# Patient Record
Sex: Male | Born: 1995 | Race: Black or African American | Hispanic: No | Marital: Single | Smoking: Never smoker
Health system: Southern US, Community
[De-identification: ages and names within clinical notes are randomized; demographics above are authoritative.]

## PROBLEM LIST (undated history)

## (undated) DIAGNOSIS — F32A Depression, unspecified: Secondary | ICD-10-CM

## (undated) DIAGNOSIS — F319 Bipolar disorder, unspecified: Secondary | ICD-10-CM

## (undated) DIAGNOSIS — F329 Major depressive disorder, single episode, unspecified: Secondary | ICD-10-CM

## (undated) HISTORY — PX: TESTICLE SURGERY: SHX794

## (undated) HISTORY — PX: HERNIA REPAIR: SHX51

---
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
## 2017-02-15 ENCOUNTER — Ambulatory Visit (INDEPENDENT_AMBULATORY_CARE_PROVIDER_SITE_OTHER): Payer: BC Managed Care – PPO | Admitting: Family Medicine

## 2017-02-15 VITALS — BP 118/60 | HR 80 | Temp 98.3°F | Ht 72.0 in | Wt 221.0 lb

## 2017-02-15 DIAGNOSIS — Z0000 Encounter for general adult medical examination without abnormal findings: Secondary | ICD-10-CM

## 2017-02-15 MED ORDER — LAMOTRIGINE 100 MG PO TABS: 100.0000 mg | ORAL_TABLET | Freq: Every day | ORAL | 0 refills | Status: DC

## 2017-02-15 NOTE — Progress Notes (Signed)
Subjective:     Kyle Mcmahon is a 21 y.o. male who presents to [facility] today for comprehensive physical examination:  CPE  Concerns:  None today.   - He does alert me that he has been Started on Lamictal while at college for anxiety and depression issues. He is on 100 mg daily. His has experienced a notable improvement in his symptoms and decrease in his depression since being started this medicine. [facility]  - living at home with mom this summer, trying to find a job.  He has noted some weight gain since being college. He is increasing his activity to try to combat this.  Last physical 2 years ago Eye exam:  Declined -- no changes or worsening of his vision Dental exam every six months.   PMH reviewed. Patient is a nonsmoker.   No past medical history on file. Past Surgical History:  Procedure Laterality Date  . HERNIA REPAIR     baby  . 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 No current outpatient prescriptions on file.   No current facility-administered medications for this visit.     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  Wants to major in media/journalism  Family History:  Mother also takes lamictal.  She is on the for bipolar disorder.   Review of Systems  Constitutional: Negative for fever.  HENT: Negative for congestion.  Occasional cerumen impactions, but doing well recently.  Eyes: Negative for blurred vision.  Respiratory: Negative for cough and wheezing.   Cardiovascular: Negative for chest pain, palpitations and leg swelling.  Gastrointestinal: Negative for nausea, vomiting and abdominal pain.  Genitourinary: Negative for dysuria, hematuria and flank pain.  Skin: Negative for rash.  Psychiatric/Behavioral: Negative  for  suicidal ideas.  Does endorse some depressive symptoms as above.     Impression/Plan: 1. Complete Physical Examination: anticipatory guidance provided.  - Otherwise doing very well.  No concerns today.   - Discussed following up If he needs anything from us this summer. Can also use a follow-up with us will his school if you would like. -He is continuing to follow with his psychiatrist. He can still see us if he has any issues. Follow-up in one year

## 2017-02-15 NOTE — Patient Instructions (Signed)
It was very good to see you again today.  Everything looks good today from my standpoint.    Best of luck next year at school.  If you need anything over the summer or next year, let us know.    Cheer hard during basketball season!

## 2017-02-15 NOTE — Assessment & Plan Note (Signed)
Doing well today.   States he has received his tetanus -- he is at college so I'm assuming he would have had this done before enrolling.   Will check [facility] to make sure. Otherwise discussed safe sexual practices while at school. FU in 1 year.

## 2017-02-17 ENCOUNTER — Encounter: Payer: Self-pay | Admitting: Student

## 2017-02-17 ENCOUNTER — Ambulatory Visit (INDEPENDENT_AMBULATORY_CARE_PROVIDER_SITE_OTHER): Payer: BC Managed Care – PPO | Admitting: Student

## 2017-02-17 DIAGNOSIS — L0291 Cutaneous abscess, unspecified: Secondary | ICD-10-CM

## 2017-02-17 MED ORDER — CEPHALEXIN 500 MG PO CAPS: 500.0000 mg | ORAL_CAPSULE | Freq: Four times a day (QID) | ORAL | 0 refills | Status: AC

## 2017-02-17 NOTE — Patient Instructions (Addendum)
Follow up as needed Take antibiotics as prescribed If you have fevers or worsening pain, call the office Call the office with questions or concerns

## 2017-02-17 NOTE — Progress Notes (Signed)
   Subjective:    Patient ID: Kyle MinorJohn C Mcmahon, male    DOB: [birth year], 21 y.o.   MRN: [id number]   CC: bump on left nipple  HPI: 21 y/o male presents for lump on left breast  Lump on left breast - note dit yesterday AM - looks like a large pimple - painful, no fevers - no surrounding rash - he feels a hair follicle got infected  Smoking status reviewed  Review of Systems  Per HPI, else denies chest pain, shortness of breath,   Objective:  BP 110/80   Temp 97.5 F (36.4 C) (Oral)   Wt 222 lb (100.7 kg)   SpO2 98%   BMI 30.11 kg/m  Vitals and nursing note reviewed  General: NAD Cardiac: RRR, Respiratory: CTAB, normal effort Skin: approx 3-4 mm in diameter abscess at the 11 oclock position of the left nipple, minimal surrounding erythema, else warm and dry, no rashes noted Neuro: alert and oriented, no focal deficits   Assessment & Plan:    Abscess Small abscess drained in the clinic without issues ( See procedure note).  -Keflex for possible surrounding infection - call as needed    Koren Sermersheim A. Kennon RoundsHaney MD, MS Family Medicine Resident PGY-3 Pager [phone]

## 2017-02-17 NOTE — Assessment & Plan Note (Signed)
Small abscess drained in the clinic without issues ( See procedure note).  -Keflex for possible surrounding infection - call as needed

## 2017-02-17 NOTE — Progress Notes (Signed)
PROCEDURE NOTE: Verbal consent obtained. Risks and benefits of the procedure were explained to the patient. Patient made an informed decision to proceed with the procedure. Betadine prep per usual protocol. Local anesthesia obtained with Pain Ease spray  2-3 mm cm incision made with 11 blade along lesion.  Copious purulence expressed. Lesion explored revealing no loculations. No packing usued  Dressed. Wound care instructions including precautions with patient. Patient tolerated the procedure well.   Daquarius Dubeau A. Kennon RoundsHaney MD, MS Family Medicine Resident PGY-3 Pager [phone]

## 2017-05-07 ENCOUNTER — Emergency Department (HOSPITAL_COMMUNITY)
Admission: EM | Admit: 2017-05-07 | Discharge: 2017-05-08 | Disposition: A | Discharge status: Home / Self Care | Payer: BC Managed Care – PPO | Attending: Emergency Medicine | Admitting: Emergency Medicine

## 2017-05-07 ENCOUNTER — Encounter (HOSPITAL_COMMUNITY): Payer: Self-pay

## 2017-05-07 DIAGNOSIS — K5900 Constipation, unspecified: Secondary | ICD-10-CM | POA: Diagnosis not present

## 2017-05-07 DIAGNOSIS — Z79899 Other long term (current) drug therapy: Secondary | ICD-10-CM | POA: Diagnosis not present

## 2017-05-07 LAB — COMPREHENSIVE METABOLIC PANEL
ALBUMIN: 4.4 g/dL (ref 3.5–5.0)
ALT: 20 U/L (ref 17–63)
ANION GAP: 8 (ref 5–15)
AST: 23 U/L (ref 15–41)
Alkaline Phosphatase: 61 U/L (ref 38–126)
BUN: 15 mg/dL (ref 6–20)
CHLORIDE: 102 mmol/L (ref 101–111)
CO2: 29 mmol/L (ref 22–32)
Calcium: 9.5 mg/dL (ref 8.9–10.3)
Creatinine, Ser: 1.1 mg/dL (ref 0.61–1.24)
GFR calc non Af Amer: >60 mL/min (ref >60)
GLUCOSE: 75 mg/dL (ref 65–99)
POTASSIUM: 3.8 mmol/L (ref 3.5–5.1)
SODIUM: 139 mmol/L (ref 135–145)
Total Bilirubin: 0.9 mg/dL (ref 0.3–1.2)
Total Protein: 7.8 g/dL (ref 6.5–8.1)

## 2017-05-07 LAB — URINALYSIS, ROUTINE W REFLEX MICROSCOPIC
BILIRUBIN URINE: NEGATIVE
Glucose, UA: NEGATIVE mg/dL
HGB URINE DIPSTICK: NEGATIVE
Ketones, ur: NEGATIVE mg/dL
Leukocytes, UA: NEGATIVE
Nitrite: NEGATIVE
PH: 6 (ref 5.0–8.0)
Protein, ur: NEGATIVE mg/dL
SPECIFIC GRAVITY, URINE: 1.023 (ref 1.005–1.030)

## 2017-05-07 LAB — CBC
HEMATOCRIT: 41.7 % (ref 39.0–52.0)
HEMOGLOBIN: 14.1 g/dL (ref 13.0–17.0)
MCH: 27.6 pg (ref 26.0–34.0)
MCHC: 33.8 g/dL (ref 30.0–36.0)
MCV: 81.8 fL (ref 78.0–100.0)
Platelets: 305 10*3/uL (ref 150–400)
RBC: 5.1 MIL/uL (ref 4.22–5.81)
RDW: 13.2 % (ref 11.5–15.5)
WBC: 9.9 10*3/uL (ref 4.0–10.5)

## 2017-05-07 LAB — LIPASE, BLOOD: LIPASE: 38 U/L (ref 11–51)

## 2017-05-07 MED ORDER — ONDANSETRON 4 MG PO TBDP
4.0000 mg | ORAL_TABLET | Freq: Once | ORAL | Status: AC | PRN
  Administered 2017-05-07: 4 mg via ORAL

## 2017-05-07 MED ORDER — ONDANSETRON 4 MG PO TBDP
ORAL_TABLET | ORAL | Status: AC
  Filled 2017-05-07: qty 1

## 2017-05-07 NOTE — ED Triage Notes (Signed)
Onset 2 nights ago abd pain, constipation, and nausea.  Seen at [facility], was given Maalox, Zofran, Carafate, lidocaine viscous.  Yesterday pt did two enemas and two laxatives, good results.  Pt still nauseated and has abd pain.

## 2017-05-07 NOTE — ED Notes (Signed)
Pt would like to wait for provider to see him before IV is placed

## 2017-05-08 ENCOUNTER — Emergency Department (HOSPITAL_COMMUNITY): Payer: BC Managed Care – PPO

## 2017-05-08 MED ORDER — DOCUSATE SODIUM 100 MG PO CAPS: 100.0000 mg | ORAL_CAPSULE | Freq: Two times a day (BID) | ORAL | 0 refills | Status: DC

## 2017-05-08 MED ORDER — POLYETHYLENE GLYCOL 3350 17 GM/SCOOP PO POWD: 17.0000 g | Freq: Two times a day (BID) | ORAL | 0 refills | Status: DC

## 2017-05-08 NOTE — Discharge Instructions (Signed)
Take 2 tablets per day of the Colace until your stools soften, and then take 1 tablet per day until regular.  Take Miralax daily for 2 weeks.

## 2017-05-08 NOTE — ED Provider Notes (Signed)
[facility]-EMERGENCY DEPT Provider Note   CSN: [id number] Arrival date & time: 05/07/17  2023     History   Chief Complaint Chief Complaint  Patient presents with  . Abdominal Pain    HPI Kyle Mcmahon is a 21 y.o. male.  Patient presents to the emergency department with chief complaint of constipation. He states that he has been constipated for the past week. He reports some associated abdominal discomfort. He reports associated nausea, but denies any vomiting. He denies any fevers or chills. He has tried taking a laxative and enema with some relief. There are no other associated symptoms. There are no modifying factors.   The history is provided by the patient. No language interpreter was used.    History reviewed. No pertinent past medical history.  Patient Active Problem List   Diagnosis Date Noted  . Abscess 02/17/2017  . Preventative health care 08/05/2014    Past Surgical History:  Procedure Laterality Date  . HERNIA REPAIR     baby  . 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 Authorizing Provider  lamoTRIgine (LAMICTAL) 100 MG tablet Take 1 tablet (100 mg total) by mouth daily. Prescribed by psychiatrist at [facility] Patient taking differently: Take 100 mg by mouth at bedtime. Prescribed by psychiatrist at [facility] 02/15/17  Yes Tobey Grim, MD    Family History History reviewed. No pertinent family history.  Social History Social History  Substance Use Topics  . Smoking status: Never Smoker  . Smokeless tobacco: Never Used  . Alcohol use No     Allergies   Patient has no known allergies.   Review of Systems Review of Systems  All other systems reviewed and are negative.    Physical Exam Updated Vital Signs BP (!) 153/77 (BP Location: Right Arm)   Pulse 63   Temp 98.5 F (36.9 C) (Oral)   Resp 18   SpO2 100%   Physical Exam    Constitutional: He is oriented to person, place, and time. He appears well-developed and well-nourished.  HENT:  Head: Normocephalic and atraumatic.  Eyes: Pupils are equal, round, and reactive to light. Conjunctivae and EOM are normal. Right eye exhibits no discharge. Left eye exhibits no discharge. No scleral icterus.  Neck: Normal range of motion. Neck supple. No JVD present.  Cardiovascular: Normal rate, regular rhythm and normal heart sounds.  Exam reveals no gallop and no friction rub.   No murmur heard. Pulmonary/Chest: Effort normal and breath sounds normal. No respiratory distress. He has no wheezes. He has no rales. He exhibits no tenderness.  Abdominal: Soft. He exhibits no distension and no mass. There is no tenderness. There is no rebound and no guarding.  No focal abdominal tenderness, no RLQ tenderness or pain at McBurney's point, no RUQ tenderness or Murphy's sign, no left-sided abdominal tenderness, no fluid wave, or signs of peritonitis   Musculoskeletal: Normal range of motion. He exhibits no edema or tenderness.  Neurological: He is alert and oriented to person, place, and time.  Skin: Skin is warm and dry.  Psychiatric: He has a normal mood and affect. His behavior is normal. Judgment and thought content normal.  Nursing note and vitals reviewed.    ED Treatments / Results  Labs (all labs ordered are listed, but only abnormal results are displayed) Labs Reviewed  LIPASE, BLOOD  COMPREHENSIVE METABOLIC PANEL  CBC  URINALYSIS, ROUTINE W REFLEX MICROSCOPIC    EKG  EKG Interpretation None       Radiology No results found.  Procedures Procedures (including critical care time)  Medications Ordered in ED Medications  ondansetron (ZOFRAN-ODT) disintegrating tablet 4 mg (4 mg Oral Given 05/07/17 2042)     Initial Impression / Assessment and Plan / ED Course  I have reviewed the triage vital signs and the nursing notes.  Pertinent labs & imaging results  that were available during my care of the patient were reviewed by me and considered in my medical decision making (see chart for details).     Patient with generalized abdominal discomfort. He does not have any focal tenderness on exam. His vital signs and labs are unremarkable. He reports that he has been constipated, I believe that this likely the cause of his abdominal discomfort, but will check plain films.  Anticipate discharge to home with change in bowel regimen.  Imaging consistent with constipation.  Final Clinical Impressions(s) / ED Diagnoses   Final diagnoses:  Constipation, unspecified constipation type    New Prescriptions Discharge Medication List as of 05/08/2017  2:29 AM    START taking these medications   Details  docusate sodium (COLACE) 100 MG capsule Take 1 capsule (100 mg total) by mouth every 12 (twelve) hours., Starting Sun 05/08/2017, Print    polyethylene glycol powder (GLYCOLAX/MIRALAX) powder Take 17 g by mouth 2 (two) times daily. Until daily soft stools  OTC, Starting Sun 05/08/2017, Print         Roxy Horseman, PA-C 05/08/17 0606    Ward, Layla Maw, DO 05/08/17 [phone]

## 2017-07-10 ENCOUNTER — Encounter (HOSPITAL_COMMUNITY): Payer: Self-pay | Admitting: Behavioral Health

## 2017-07-10 ENCOUNTER — Emergency Department (HOSPITAL_COMMUNITY)
Admission: EM | Admit: 2017-07-10 | Discharge: 2017-07-12 | Disposition: A | Discharge status: Other Acute Inpatient Hospital | Payer: BC Managed Care – PPO | Attending: Emergency Medicine | Admitting: Emergency Medicine

## 2017-07-10 DIAGNOSIS — F339 Major depressive disorder, recurrent, unspecified: Secondary | ICD-10-CM | POA: Diagnosis not present

## 2017-07-10 DIAGNOSIS — F329 Major depressive disorder, single episode, unspecified: Secondary | ICD-10-CM | POA: Diagnosis present

## 2017-07-10 DIAGNOSIS — R450 Nervousness: Secondary | ICD-10-CM | POA: Diagnosis not present

## 2017-07-10 DIAGNOSIS — F1210 Cannabis abuse, uncomplicated: Secondary | ICD-10-CM | POA: Diagnosis not present

## 2017-07-10 DIAGNOSIS — F419 Anxiety disorder, unspecified: Secondary | ICD-10-CM | POA: Diagnosis not present

## 2017-07-10 DIAGNOSIS — Z79899 Other long term (current) drug therapy: Secondary | ICD-10-CM | POA: Insufficient documentation

## 2017-07-10 DIAGNOSIS — R4689 Other symptoms and signs involving appearance and behavior: Secondary | ICD-10-CM | POA: Diagnosis not present

## 2017-07-10 DIAGNOSIS — F19959 Other psychoactive substance use, unspecified with psychoactive substance-induced psychotic disorder, unspecified: Secondary | ICD-10-CM | POA: Diagnosis not present

## 2017-07-10 HISTORY — DX: Depression, unspecified: F32.A

## 2017-07-10 HISTORY — DX: Major depressive disorder, single episode, unspecified: F32.9

## 2017-07-10 LAB — COMPREHENSIVE METABOLIC PANEL
ALBUMIN: 4.3 g/dL (ref 3.5–5.0)
ALK PHOS: 59 U/L (ref 38–126)
ALT: 24 U/L (ref 17–63)
AST: 33 U/L (ref 15–41)
Anion gap: 14 (ref 5–15)
BUN: 9 mg/dL (ref 6–20)
CALCIUM: 9.3 mg/dL (ref 8.9–10.3)
CO2: 24 mmol/L (ref 22–32)
Chloride: 98 mmol/L — ABNORMAL LOW (ref 101–111)
Creatinine, Ser: 0.95 mg/dL (ref 0.61–1.24)
GFR calc Af Amer: >60 mL/min (ref >60)
GFR calc non Af Amer: >60 mL/min (ref >60)
Glucose, Bld: 160 mg/dL — ABNORMAL HIGH (ref 65–99)
Potassium: 3.3 mmol/L — ABNORMAL LOW (ref 3.5–5.1)
SODIUM: 136 mmol/L (ref 135–145)
TOTAL PROTEIN: 8.3 g/dL — AB (ref 6.5–8.1)
Total Bilirubin: 1.1 mg/dL (ref 0.3–1.2)

## 2017-07-10 LAB — SALICYLATE LEVEL: Salicylate Lvl: <7 mg/dL (ref 2.8–30.0)

## 2017-07-10 LAB — CBC WITH DIFFERENTIAL/PLATELET
BASOS ABS: 0 10*3/uL (ref 0.0–0.1)
Basophils Relative: 0 %
EOS ABS: 0 10*3/uL (ref 0.0–0.7)
EOS PCT: 0 %
HCT: 41.8 % (ref 39.0–52.0)
HEMOGLOBIN: 14.2 g/dL (ref 13.0–17.0)
Lymphocytes Relative: 17 %
Lymphs Abs: 1.6 10*3/uL (ref 0.7–4.0)
MCH: 28 pg (ref 26.0–34.0)
MCHC: 34 g/dL (ref 30.0–36.0)
MCV: 82.3 fL (ref 78.0–100.0)
Monocytes Absolute: 0.6 10*3/uL (ref 0.1–1.0)
Monocytes Relative: 7 %
NEUTROS PCT: 76 %
Neutro Abs: 7 10*3/uL (ref 1.7–7.7)
PLATELETS: 294 10*3/uL (ref 150–400)
RBC: 5.08 MIL/uL (ref 4.22–5.81)
RDW: 13.5 % (ref 11.5–15.5)
WBC: 9.3 10*3/uL (ref 4.0–10.5)

## 2017-07-10 LAB — RAPID URINE DRUG SCREEN, HOSP PERFORMED
Amphetamines: NOT DETECTED
Barbiturates: NOT DETECTED
Benzodiazepines: NOT DETECTED
COCAINE: NOT DETECTED
OPIATES: NOT DETECTED
Tetrahydrocannabinol: NOT DETECTED

## 2017-07-10 LAB — ACETAMINOPHEN LEVEL: Acetaminophen (Tylenol), Serum: <10 ug/mL — ABNORMAL LOW (ref 10–30)

## 2017-07-10 LAB — ETHANOL

## 2017-07-10 LAB — CK: CK TOTAL: 244 U/L (ref 49–397)

## 2017-07-10 MED ORDER — LORAZEPAM 1 MG PO TABS
2.0000 mg | ORAL_TABLET | Freq: Four times a day (QID) | ORAL | Status: DC | PRN
Start: 2017-07-10 — End: 2017-07-11

## 2017-07-10 MED ORDER — LORAZEPAM 1 MG PO TABS
2.0000 mg | ORAL_TABLET | ORAL | Status: AC
  Filled 2017-07-10: qty 2

## 2017-07-10 MED ORDER — LORAZEPAM 1 MG PO TABS: 2.0000 mg | ORAL_TABLET | Freq: Four times a day (QID) | ORAL | Status: DC | PRN

## 2017-07-10 MED ORDER — SODIUM CHLORIDE 0.9 % IV SOLN
INTRAVENOUS | Status: DC
  Administered 2017-07-10: 11:00:00 via INTRAVENOUS

## 2017-07-10 MED ORDER — LORAZEPAM 2 MG/ML IJ SOLN: 2.0000 mg | INTRAMUSCULAR | Status: AC

## 2017-07-10 MED ORDER — LORAZEPAM 2 MG/ML IJ SOLN
2.0000 mg | Freq: Four times a day (QID) | INTRAMUSCULAR | Status: DC | PRN
  Administered 2017-07-10: 2 mg via INTRAMUSCULAR
  Filled 2017-07-10: qty 1

## 2017-07-10 NOTE — Progress Notes (Signed)
Parents were concerned about his disposition but the patient refused to sign a release of information along with other forms of care.  Unfortunately, this information cannot be communicated until the patient consents.  Celso AmyJamie Lord, PMHNP

## 2017-07-10 NOTE — ED Triage Notes (Signed)
Pt in via EMS with [employer]- Per EMS and [employer] officer, pt called after pt was found catatonic but when EMS arrived, pt was psychotic and combative. Pt parents add that pt has hx of depression, paranoia and has just started Lamictal within the past week. Pt admits to smoking marijuana. Pt received 10 mg IM haldol en route for aggression. [facility] sleepy [facility]. Pt in NAD

## 2017-07-10 NOTE — ED Notes (Addendum)
PRN IM provided for pt posturing as though displaying EPS with head pull and facial grimace. EDP saw pt and medication provided pt now still lying in bed contorted but supple musculature. And is now chill to the touch. Will continue to assess.

## 2017-07-10 NOTE — BHH Counselor (Signed)
Attempted assessment.  Pt refused, acting as if catatonic, although per report, he had been upright and speaking moments before.  Will attempt assessment again soon.

## 2017-07-10 NOTE — ED Provider Notes (Signed)
Asked to evaluate patient by nursing in the behavioral health area.  Patient with some bizarre behavior.  But no cogwheeling no rigidity.  Will give a trial of some Ativan p.o. or IM orders placed for this.  Patient appears to be nontoxic no acute distress.  If this does not work patient may require some Geodon.  Patient received Haldol according to notes prior to arrival.  He arrived around 8 in the morning.   Vanetta MuldersZackowski, Rubel Heckard, MD 07/10/17 2221

## 2017-07-10 NOTE — ED Notes (Addendum)
Pt received, diaphoretic and slightly febrile temp 99.2 oral. Pt forehead covered in sweat and pt walking very slowly with short step gait to the door of room 39. Pt asked why he was being sent over here. And that was the only time the pt spoke.  Pt not answering writers questions but holding intense eye contact with lips tightly clinched and strained taught posture. Pt gave no verbal answers for assessment questions related to pain, but slightly shook head in 'no' gesture when asked about hearing or seeing things the writer could not at the time.

## 2017-07-10 NOTE — ED Notes (Signed)
Pt mother called at this time to inquire about pt signing consent. This RN told mother consent to release information has not been signed. Mother [phone]

## 2017-07-10 NOTE — ED Notes (Signed)
Bed: [id number] Expected date:  Expected time:  Means of arrival:  Comments: bigalow

## 2017-07-10 NOTE — ED Notes (Signed)
Dinner is placed at pt bedside. Pt mother and sister at bed side at this time

## 2017-07-10 NOTE — ED Notes (Signed)
Pt mother at bedside at this time. Pt refuses to communicate with staff and family at this time.

## 2017-07-10 NOTE — ED Notes (Signed)
Pt and family both informed in detail the authorization for use/disclosure of protected health information and pt is adamant about NOT allowing family to have information for the duration of his visit. Family advised by this RN to discontinue the request for patient to sign necessary documentation at this time.

## 2017-07-10 NOTE — ED Notes (Signed)
Pt father at bedside at this time. NAD noted.

## 2017-07-10 NOTE — ED Notes (Signed)
Pt father at bedside at this time. 

## 2017-07-10 NOTE — BH Assessment (Signed)
Assessment Note  Kyle Mcmahon is a 21 y.o. male who presented to [facility] ED on 07/10/17 with complaint of paranoia.  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  Per report, Pt is under psychiatric care there for long-standing depression, and his psychiatrist prescribes Lamictal and Prozac (Prozac added within the last 10 days).  Per report, Pt endorsed paranoia, agitation, and marijuana use.  Also per report, Pt denied SI and HI.  UDS was clear, and so it is recommended that Pt be screened for K2.  Pt was non-responsive to questions, presenting as asleep.  Nurse stated that Pt was alert and speaking shortly before author attempted to assess.  Pt could not or would not be roused for assessment.  Author attempted assessment twice.  Per mother, she went [place].  She observed that Pt did not seem like himself.  On Saturday, Pt called to say he was upset.  [place]  This morning, Pt was awake before 6 AM, shaking and yelling that he was scared.  Per mother, Pt began gesticulating in unusual manner.  Pt's mother called EMS.  EMS administered a Haldol to Pt because of his aggressive behavior en route to the hospital.  Also per mother, Pt has been treated for depression for approximately eight months.  He receives outpatient psychiatric services.  He has not been treated inpatient services.  Consulted with Molli KnockJ. Lord, DNP.  She recommended that Pt be rounded on in AM after course of Ativan.  Diagnosis: MDD, Recurrent  Past Medical History:  Past Medical History:  Diagnosis Date  . Depression     Past Surgical History:  Procedure Laterality Date  . HERNIA REPAIR     baby  . 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  Social History:  reports that he has never smoked. He has never used smokeless tobacco. He reports that he does not drink alcohol or use  drugs.  Additional Social History:  Alcohol / Drug Use Pain Medications: See MAR Prescriptions: See MAR Over the Counter: See MAR  CIWA: CIWA-Ar BP: 140/71 Pulse Rate: (!) 105 COWS:    Allergies: No Known Allergies  Home Medications:  (Not in a hospital admission)  OB/GYN Status:  No LMP for male patient.                                                    Advance Directives (For Healthcare) Does Patient Have a Medical Advance Directive?: No          Disposition:     On Site Evaluation by:   Reviewed with Physician:    Dorris FetchEugene T Cariana Karge 07/10/2017 10:05 AM

## 2017-07-10 NOTE — ED Notes (Signed)
Pt aunt is at bedside at this time.

## 2017-07-10 NOTE — BH Assessment (Signed)
Addendum to assessment:  Pt was roused at 11:00 am.  Author went to assess Pt.  Pt was very guarded in demeanor, asked his mother "Is this real?"  He was observed staring at Thereasa Parkinauthor, stated that Thereasa Parkinauthor was not actually writing anything down.  ''How is this going to help me?"  Pt reported that he is stressed because he is under-performing at [employer].  As a result, "I'm down, I've been smoking marijuana, I think I'm addicted."  Pt also endorsed hearing voices and seeing hallucinations, although he would not elaborate.  Pt also endorsed suicidal ideation.  He would not elaborate.  Pt described his appetite and sleep patterns as "messed up."  Pt terminated assessment by staring and not answering further questions.  During assessment, Pt presented as drowsy and not oriented to time, place, or situation.  He stared.  Pt was dressed in scrubs and appeared appropriately groomed.  Pt endorsed suicidal ideation (apparently without plan or intent), auditory and visual hallucination, confusion, agitation, and significant use of marijuana (he would not answer questions to amount or last use).  Pt denied use of synthetic marijuana.  Pt's speech was slow, halting, irritable.  Thought processes were slow.  Thought content indicated paranoia.  Memory and concentration were poor.  Impulse control, judgment, and insight were poor.  Consulted with Molli KnockJ. Lord, DNP who recommended inpatient placement.  Dx:  MDD; Cannabis-induced psychosis

## 2017-07-10 NOTE — ED Notes (Signed)
CBG - 141 ° °

## 2017-07-10 NOTE — ED Notes (Signed)
Bed: WA28 Expected date:  Expected time:  Means of arrival:  Comments: 

## 2017-07-10 NOTE — ED Notes (Signed)
Pt aunt at bedside at this time.

## 2017-07-10 NOTE — ED Provider Notes (Signed)
[facility]-EMERGENCY DEPT Provider Note   CSN: [id number] Arrival date & time: 07/10/17  [id number]     History   Chief Complaint Chief Complaint  Patient presents with  . Medical Clearance    HPI Kyle Mcmahon is a 21 y.o. male.  21 year old male with history of depression who presents from home after having episode of severe paranoia with agitation.  Patient recently started on Prozac about a week ago.  He also admitted to using marijuana.  According to family and long enforcement, patient at home was very paranoid and became agitated when they attempted to talk to them.  He did not appear to be responding to internal stimuli.  He did not endorse any homicidal or suicidal ideations.  He has no prior history of same.  EMS was called and because of his aggression he was given 2 mg of Haldol IM.  Patient transported here for further management      No past medical history on file.  Patient Active Problem List   Diagnosis Date Noted  . Abscess 02/17/2017  . Preventative health care 08/05/2014    Past Surgical History:  Procedure Laterality Date  . HERNIA REPAIR     baby  . 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 Authorizing Provider  docusate sodium (COLACE) 100 MG capsule Take 1 capsule (100 mg total) by mouth every 12 (twelve) hours. 05/08/17   Roxy HorsemanBrowning, Robert, PA-C  lamoTRIgine (LAMICTAL) 100 MG tablet Take 1 tablet (100 mg total) by mouth daily. [facility] Patient taking differently: Take 100 mg by mouth at bedtime. [facility] 02/15/17   Tobey GrimWalden, Jeffrey H, MD  polyethylene glycol powder (GLYCOLAX/MIRALAX) powder Take 17 g by mouth 2 (two) times daily. Until daily soft stools  OTC 05/08/17   Roxy HorsemanBrowning, Robert, PA-C    Family History No family history on file.  Social History Social History    Substance Use Topics  . Smoking status: Never Smoker  . Smokeless tobacco: Never Used  . Alcohol use No     Allergies   Patient has no known allergies.   Review of Systems Review of Systems  Unable to perform ROS: Psychiatric disorder     Physical Exam Updated Vital Signs BP 140/71 (BP Location: Left Arm)   Pulse (!) 105   Temp 99.4 F (37.4 C) (Rectal)   Resp (!) 26   SpO2 96%   Physical Exam  Constitutional: He is oriented to person, place, and time. He appears well-developed and well-nourished.  Non-toxic appearance. No distress.  HENT:  Head: Normocephalic and atraumatic.  Eyes: Pupils are equal, round, and reactive to light. Conjunctivae, EOM and lids are normal.  Neck: Normal range of motion. Neck supple. No tracheal deviation present. No thyroid mass present.  Cardiovascular: Regular rhythm and normal heart sounds.  Tachycardia present.  Exam reveals no gallop.   No murmur heard. Pulmonary/Chest: Effort normal and breath sounds normal. No stridor. No respiratory distress. He has no decreased breath sounds. He has no wheezes. He has no rhonchi. He has no rales.  Abdominal: Soft. Normal appearance and bowel sounds are normal. He exhibits no distension. There is no tenderness. There is no rebound and no CVA tenderness.  Musculoskeletal: Normal range of motion. He exhibits no edema or tenderness.  Neurological:  He is alert and oriented to person, place, and time. He has normal strength. No cranial nerve deficit or sensory deficit. GCS eye subscore is 4. GCS verbal subscore is 5. GCS motor subscore is 6.  Skin: Skin is warm and dry. No abrasion and no rash noted.  Psychiatric: His affect is inappropriate. His speech is delayed. He is withdrawn.  Nursing note and vitals reviewed.    ED Treatments / Results  Labs (all labs ordered are listed, but only abnormal results are displayed) Labs Reviewed  CBC WITH DIFFERENTIAL/PLATELET  COMPREHENSIVE METABOLIC PANEL   ETHANOL  RAPID URINE DRUG SCREEN, HOSP PERFORMED  SALICYLATE LEVEL  ACETAMINOPHEN LEVEL  CK    EKG  EKG Interpretation None       Radiology No results found.  Procedures Procedures (including critical care time)  Medications Ordered in ED Medications  0.9 %  sodium chloride infusion (not administered)     Initial Impression / Assessment and Plan / ED Course  I have reviewed the triage vital signs and the nursing notes.  Pertinent labs & imaging results that were available during my care of the patient were reviewed by me and considered in my medical decision making (see chart for details).     Patient medically cleared for psychiatric disposition  Final Clinical Impressions(s) / ED Diagnoses   Final diagnoses:  None    New Prescriptions New Prescriptions   No medications on file     Lorre Nick, MD 07/10/17 1108

## 2017-07-10 NOTE — ED Notes (Signed)
edp notified of pt gross muscle shakes and diaphoresis.

## 2017-07-10 NOTE — ED Notes (Signed)
Pt mother called requesting update of pt status. Pt refuses to give verbal consent and/or written consent for update at this time.

## 2017-07-10 NOTE — ED Notes (Signed)
Pt asleep in bed at this time. NAD noted

## 2017-07-10 NOTE — ED Notes (Signed)
Mother at bedside at this time. Pt is verbally non responsive and refuses to answer any questions at this time.

## 2017-07-10 NOTE — ED Notes (Signed)
Pt refuses to eat lunch and take medications. Pt states "I need to eat first." pt is standing in doorway of restroom with meal tray in from of him. Pt refuses to sit down.

## 2017-07-11 DIAGNOSIS — F19959 Other psychoactive substance use, unspecified with psychoactive substance-induced psychotic disorder, unspecified: Secondary | ICD-10-CM | POA: Diagnosis not present

## 2017-07-11 DIAGNOSIS — F419 Anxiety disorder, unspecified: Secondary | ICD-10-CM | POA: Diagnosis not present

## 2017-07-11 DIAGNOSIS — F1210 Cannabis abuse, uncomplicated: Secondary | ICD-10-CM | POA: Diagnosis not present

## 2017-07-11 DIAGNOSIS — F22 Delusional disorders: Secondary | ICD-10-CM

## 2017-07-11 DIAGNOSIS — R4689 Other symptoms and signs involving appearance and behavior: Secondary | ICD-10-CM

## 2017-07-11 DIAGNOSIS — R450 Nervousness: Secondary | ICD-10-CM | POA: Diagnosis not present

## 2017-07-11 MED ORDER — LORAZEPAM 1 MG PO TABS
2.0000 mg | ORAL_TABLET | Freq: Three times a day (TID) | ORAL | Status: DC
  Administered 2017-07-11 – 2017-07-12 (×3): 2 mg via ORAL
  Filled 2017-07-11 (×3): qty 2

## 2017-07-11 NOTE — Progress Notes (Signed)
07/11/17 1413:  LRT went to pt room to offer activities, pt was sleep.   Caroll RancherMarjette Mitsuo Budnick, LRT/CTRS

## 2017-07-11 NOTE — ED Notes (Signed)
Patient seen resting 

## 2017-07-11 NOTE — ED Notes (Signed)
Pt now resting after prolonged span of bizarre had movements and gestures where pt would rigidly flex one hand/arm for several moments then would flex the other.

## 2017-07-11 NOTE — Consult Note (Addendum)
[facility] Psychiatry Consult   Reason for Consult:  Altered mental status  Referring Physician:  EDP Patient Identification: Kyle Mcmahon MRN:  [id number] Principal Diagnosis: Behavior change due to substance use   Diagnosis:   Patient Active Problem List   Diagnosis Date Noted  . 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 Abscess [L02.91] 02/17/2017  . Preventative health care [Z00.00] 08/05/2014    Total Time spent with patient: 30 minutes  Subjective:   Kyle Mcmahon is a 21 y.o. male patient admitted with paranoia and disorganized behavior and thoughts.   HPI:   Kyle Mcmahon had significant speech latency, mutism and staring throughout the interview so most of the history was obtained from his mother with his permission. His mother reports that he does not do well with rejection and disappointment. He has a type A personality. He has had recent academic stressors. 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 His mother reports that he became stressed out last spring and his outpatient provider started Lamictal. His mood did not improve and he saw a Social worker. He started using alcohol to cope. He was started on Prozac on 10/21. He admits to marijuana use but he has not used since the 29th. He reports that his depression and anxiety have been up and down. His mother reports that he appeared to be almost catatonic yesterday. She denies that he has ever exhibited manic symptoms but she has a diagnosis of bipolar disorder.  Past Psychiatric History: Depression and anxiety   Risk to Self:  Denies SI Risk to Others:  None Prior Inpatient Therapy:   Denies  Prior Outpatient Therapy: Prior Outpatient Therapy: Yes Prior Therapy Dates: Ongoing Prior Therapy Facilty/Provider(s): Dr. Willaim Rayas, psychiatrist Reason for Treatment: Depression Does patient have an ACCT team?: No Does patient have [facility] services? : No Does patient have P4[facility]  services?: No  Past Medical History:  Past Medical History:  Diagnosis Date  . Depression     Past Surgical History:  Procedure Laterality Date  . HERNIA REPAIR     baby  . 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 Bipolar disorder Mother    Family Psychiatric  History: Mother has bipolar disorder.  Social History:  History  Alcohol Use No    Comment: Unknown     History  Drug Use    Comment: Pt endorsed Marijuana use; UDS was clear    Social History   Social History  . Marital status: Single    Spouse name: N/A  . Number of children: N/A  . Years of education: N/A   Social History Main Topics  . Smoking status: Never Smoker  . Smokeless tobacco: Never Used  . Alcohol use No     Comment: Unknown  . Drug use: Yes     Comment: Pt endorsed Marijuana use; UDS was clear  . Sexual activity: Not on file   Other Topics Concern  . Not on file   Social History Narrative   Lives with mother sister and maternal aunt.   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 He uses marijuana and alcohol but quantity is unknown. He denies other substance use including spice.     Allergies:  No Known Allergies  Labs:  Results for orders placed or performed during the hospital encounter of 07/10/17 (  from the past 48 hour(s))  CBC with Differential/Platelet     Status: None   Collection Time: 07/10/17  8:23 AM  Result Value Ref Range   WBC 9.3 4.0 - 10.5 K/uL   RBC 5.08 4.22 - 5.81 MIL/uL   Hemoglobin 14.2 13.0 - 17.0 g/dL   HCT 41.8 39.0 - 52.0 %   MCV 82.3 78.0 - 100.0 fL   MCH 28.0 26.0 - 34.0 pg   MCHC 34.0 30.0 - 36.0 g/dL   RDW 13.5 11.5 - 15.5 %   Platelets 294 150 - 400 K/uL   Neutrophils Relative % 76 %   Neutro Abs 7.0 1.7 - 7.7 K/uL   Lymphocytes Relative 17 %   Lymphs Abs 1.6 0.7 - 4.0 K/uL   Monocytes Relative 7 %   Monocytes Absolute 0.6 0.1 - 1.0 K/uL   Eosinophils  Relative 0 %   Eosinophils Absolute 0.0 0.0 - 0.7 K/uL   Basophils Relative 0 %   Basophils Absolute 0.0 0.0 - 0.1 K/uL  Comprehensive metabolic panel     Status: Abnormal   Collection Time: 07/10/17  8:23 AM  Result Value Ref Range   Sodium 136 135 - 145 mmol/L   Potassium 3.3 (L) 3.5 - 5.1 mmol/L   Chloride 98 (L) 101 - 111 mmol/L   CO2 24 22 - 32 mmol/L   Glucose, Bld 160 (H) 65 - 99 mg/dL   BUN 9 6 - 20 mg/dL   Creatinine, Ser 0.95 0.61 - 1.24 mg/dL   Calcium 9.3 8.9 - 10.3 mg/dL   Total Protein 8.3 (H) 6.5 - 8.1 g/dL   Albumin 4.3 3.5 - 5.0 g/dL   AST 33 15 - 41 U/L   ALT 24 17 - 63 U/L   Alkaline Phosphatase 59 38 - 126 U/L   Total Bilirubin 1.1 0.3 - 1.2 mg/dL   GFR calc non Af Amer >60 >60 mL/min   GFR calc Af Amer >60 >60 mL/min    Comment: (NOTE) The eGFR has been calculated using the CKD EPI equation. This calculation has not been validated in all clinical situations. eGFR's persistently <60 mL/min signify possible Chronic Kidney Disease.    Anion gap 14 5 - 15  Ethanol     Status: None   Collection Time: 07/10/17  8:23 AM  Result Value Ref Range   Alcohol, Ethyl (B) <10 <10 mg/dL    Comment:        LOWEST DETECTABLE LIMIT FOR SERUM ALCOHOL IS 10 mg/dL FOR MEDICAL PURPOSES ONLY   Salicylate level     Status: None   Collection Time: 07/10/17  8:23 AM  Result Value Ref Range   Salicylate Lvl <1.9 2.8 - 30.0 mg/dL  Acetaminophen level     Status: Abnormal   Collection Time: 07/10/17  8:23 AM  Result Value Ref Range   Acetaminophen (Tylenol), Serum <10 (L) 10 - 30 ug/mL    Comment:        THERAPEUTIC CONCENTRATIONS VARY SIGNIFICANTLY. A RANGE OF 10-30 ug/mL MAY BE AN EFFECTIVE CONCENTRATION FOR MANY PATIENTS. HOWEVER, SOME ARE BEST TREATED AT CONCENTRATIONS OUTSIDE THIS RANGE. ACETAMINOPHEN CONCENTRATIONS >150 ug/mL AT 4 HOURS AFTER INGESTION AND >50 ug/mL AT 12 HOURS AFTER INGESTION ARE OFTEN ASSOCIATED WITH TOXIC REACTIONS.   CK     Status: None    Collection Time: 07/10/17  8:23 AM  Result Value Ref Range   Total CK 244 49 - 397 U/L  Rapid urine drug screen (hospital  performed)     Status: None   Collection Time: 07/10/17  8:35 AM  Result Value Ref Range   Opiates NONE DETECTED NONE DETECTED   Cocaine NONE DETECTED NONE DETECTED   Benzodiazepines NONE DETECTED NONE DETECTED   Amphetamines NONE DETECTED NONE DETECTED   Tetrahydrocannabinol NONE DETECTED NONE DETECTED   Barbiturates NONE DETECTED NONE DETECTED    Comment:        DRUG SCREEN FOR MEDICAL PURPOSES ONLY.  IF CONFIRMATION IS NEEDED FOR ANY PURPOSE, NOTIFY LAB WITHIN 5 DAYS.        LOWEST DETECTABLE LIMITS FOR URINE DRUG SCREEN Drug Class       Cutoff (ng/mL) Amphetamine      1000 Barbiturate      200 Benzodiazepine   947 Tricyclics       096 Opiates          300 Cocaine          300 THC              50     Current Facility-Administered Medications  Medication Dose Route Frequency Provider Last Rate Last Dose  . 0.9 %  sodium chloride infusion   Intravenous Continuous Lacretia Leigh, MD 20 mL/hr at 07/10/17 1101    . LORazepam (ATIVAN) tablet 2 mg  2 mg Oral TID Faythe Dingwall, DO   2 mg at 07/11/17 1222   Current Outpatient Prescriptions  Medication Sig Dispense Refill  . FLUoxetine (PROZAC) 20 MG tablet Take 20 mg by mouth daily.    Marland Kitchen lamoTRIgine (LAMICTAL) 100 MG tablet Take 1 tablet (100 mg total) by mouth daily. Prescribed by psychiatrist at [facility] (Patient taking differently: Take 100 mg by mouth at bedtime. Prescribed by psychiatrist at [facility]) 30 tablet 0  . docusate sodium (COLACE) 100 MG capsule Take 1 capsule (100 mg total) by mouth every 12 (twelve) hours. (Patient not taking: Reported on 07/10/2017) 30 capsule 0  . polyethylene glycol powder (GLYCOLAX/MIRALAX) powder Take 17 g by mouth 2 (two) times daily. Until daily soft stools  OTC (Patient not taking: Reported on 07/10/2017) 225 g 0    Musculoskeletal: Strength & Muscle Tone: within  normal limits Gait & Station: normal Patient leans: N/A  Psychiatric Specialty Exam: Physical Exam  Constitutional: He appears well-developed and well-nourished.  HENT:  Head: Normocephalic and atraumatic.  Neck: Normal range of motion.  Respiratory: Effort normal.  Neurological: He is alert.  Waxing and waning attention.     Review of Systems  Psychiatric/Behavioral: Positive for depression and substance abuse. Negative for hallucinations and suicidal ideas. The patient is nervous/anxious. The patient does not have insomnia.     Blood pressure 131/70, pulse (!) 116, temperature 99.2 F (37.3 C), temperature source Oral, resp. rate 16, SpO2 98 %.There is no height or weight on file to calculate BMI.  General Appearance: Well Groomed  Eye Contact:  Absent  Speech:  Blocked and mutism.  Volume:  Decreased  Mood:  "My depression and anxiety have been up and down."  Affect:  Blunt and Constricted  Thought Process:  Disorganized  Orientation:  Other:  Oriented to self. Unable to fully assess given latency of speech and thought blocking.  Thought Content:  Unable to assess due to latency of speech and thought blocking.  Suicidal Thoughts:  No  Homicidal Thoughts:  No  Memory: Unable to assess due to latency of speech and thought blocking.  Judgement:  Impaired  Insight:  Lacking  Psychomotor Activity:  Decreased and Psychomotor Retardation  Concentration:  Concentration: Poor and Attention Span: Poor  Recall:  Poor  Fund of Knowledge:  Poor  Language:  Poor  Akathisia:  No  Handed:  Right  AIMS (if indicated):   N/A  Assets:  Housing Physical Health Resilience Social Support Talents/Skills  ADL's:  Intact  Cognition:  Impaired due to AMS.  Sleep:   Okay   Assessment: Kyle Mcmahon was admitted to [facility] ED for paranoia, disorganized thought process and behavior. On interview, he has latency of speech, mutism and psychomotor retardation. There is concern for recent use of K2  per mother report. He warrants inpatient psychiatric hospitalization for stabilization and treatment.   Treatment Plan Summary: Daily contact with patient to assess and evaluate symptoms and progress in treatment and Medication management  -Start Ativan 2 mg TID for catatonic symptoms likely secondary to substance use versus mood disorder given history of depression. -Will not restart Lamictal since there is no way to determine his last dose given altered mental status and risk for SJS. Will observe overnight for improvement with Ativan prior to making further medication adjustments and/or need for antipsychotic or antidepressant therapy.  -Consider organic workup if no symptom improvement.  Disposition: Recommend psychiatric Inpatient admission when medically cleared.  Faythe Dingwall, DO 07/11/2017 2:15 PM

## 2017-07-11 NOTE — ED Notes (Signed)
Pt signed consent for release of information for his mom and also so [facility] could be notified that he is hospitalized. Hard copy in chart.

## 2017-07-11 NOTE — ED Notes (Addendum)
error 

## 2017-07-12 ENCOUNTER — Encounter (HOSPITAL_COMMUNITY): Payer: Self-pay

## 2017-07-12 ENCOUNTER — Inpatient Hospital Stay (HOSPITAL_COMMUNITY)
Admission: EM | Admit: 2017-07-12 | Discharge: 2017-07-15 | DRG: 897 | Disposition: A | Discharge status: Home / Self Care | Payer: BC Managed Care – PPO | Source: Intra-hospital | Attending: Psychiatry | Admitting: Psychiatry

## 2017-07-12 DIAGNOSIS — F19959 Other psychoactive substance use, unspecified with psychoactive substance-induced psychotic disorder, unspecified: Principal | ICD-10-CM | POA: Diagnosis present

## 2017-07-12 DIAGNOSIS — H6123 Impacted cerumen, bilateral: Secondary | ICD-10-CM | POA: Diagnosis not present

## 2017-07-12 DIAGNOSIS — F22 Delusional disorders: Secondary | ICD-10-CM | POA: Diagnosis present

## 2017-07-12 DIAGNOSIS — R4701 Aphasia: Secondary | ICD-10-CM | POA: Diagnosis present

## 2017-07-12 DIAGNOSIS — F329 Major depressive disorder, single episode, unspecified: Secondary | ICD-10-CM | POA: Diagnosis present

## 2017-07-12 DIAGNOSIS — G4700 Insomnia, unspecified: Secondary | ICD-10-CM | POA: Diagnosis present

## 2017-07-12 DIAGNOSIS — Z818 Family history of other mental and behavioral disorders: Secondary | ICD-10-CM

## 2017-07-12 DIAGNOSIS — F339 Major depressive disorder, recurrent, unspecified: Secondary | ICD-10-CM | POA: Diagnosis not present

## 2017-07-12 DIAGNOSIS — F29 Unspecified psychosis not due to a substance or known physiological condition: Secondary | ICD-10-CM | POA: Diagnosis not present

## 2017-07-12 DIAGNOSIS — R4689 Other symptoms and signs involving appearance and behavior: Secondary | ICD-10-CM | POA: Diagnosis not present

## 2017-07-12 DIAGNOSIS — Z79899 Other long term (current) drug therapy: Secondary | ICD-10-CM

## 2017-07-12 DIAGNOSIS — F1290 Cannabis use, unspecified, uncomplicated: Secondary | ICD-10-CM | POA: Diagnosis present

## 2017-07-12 HISTORY — DX: Other psychoactive substance use, unspecified with psychoactive substance-induced psychotic disorder, unspecified: F19.959

## 2017-07-12 MED ORDER — HYDROXYZINE HCL 25 MG PO TABS
25.0000 mg | ORAL_TABLET | Freq: Three times a day (TID) | ORAL | Status: DC
  Administered 2017-07-12 – 2017-07-15 (×7): 25 mg via ORAL
  Filled 2017-07-12 (×12): qty 1

## 2017-07-12 MED ORDER — POLYETHYLENE GLYCOL 3350 17 G PO PACK: 17.0000 g | PACK | Freq: Every day | ORAL | Status: DC | PRN

## 2017-07-12 MED ORDER — POLYETHYLENE GLYCOL 3350 17 GM/SCOOP PO POWD
17.0000 g | Freq: Two times a day (BID) | ORAL | Status: DC
  Filled 2017-07-12: qty 255

## 2017-07-12 MED ORDER — LAMOTRIGINE 150 MG PO TABS
150.0000 mg | ORAL_TABLET | Freq: Every day | ORAL | Status: DC
  Administered 2017-07-12: 150 mg via ORAL
  Filled 2017-07-12 (×2): qty 1

## 2017-07-12 MED ORDER — LAMOTRIGINE 100 MG PO TABS
100.0000 mg | ORAL_TABLET | Freq: Every day | ORAL | Status: DC
  Filled 2017-07-12: qty 1

## 2017-07-12 MED ORDER — TRAZODONE HCL 50 MG PO TABS: 50.0000 mg | ORAL_TABLET | Freq: Every evening | ORAL | Status: DC | PRN

## 2017-07-12 MED ORDER — POTASSIUM CHLORIDE CRYS ER 20 MEQ PO TBCR
40.0000 meq | EXTENDED_RELEASE_TABLET | Freq: Once | ORAL | Status: AC
  Administered 2017-07-12: 40 meq via ORAL
  Filled 2017-07-12: qty 2

## 2017-07-12 MED ORDER — POLYETHYLENE GLYCOL 3350 17 GM/SCOOP PO POWD
17.0000 g | Freq: Every day | ORAL | Status: DC | PRN
  Filled 2017-07-12: qty 255

## 2017-07-12 MED ORDER — FLUOXETINE HCL 20 MG PO CAPS
20.0000 mg | ORAL_CAPSULE | Freq: Every day | ORAL | Status: DC
  Administered 2017-07-13 – 2017-07-15 (×3): 20 mg via ORAL
  Filled 2017-07-12 (×4): qty 1

## 2017-07-12 MED ORDER — ACETAMINOPHEN 325 MG PO TABS: 650.0000 mg | ORAL_TABLET | Freq: Four times a day (QID) | ORAL | Status: DC | PRN

## 2017-07-12 MED ORDER — MAGNESIUM HYDROXIDE 400 MG/5ML PO SUSP
30.0000 mL | Freq: Every day | ORAL | Status: DC | PRN
  Administered 2017-07-14: 30 mL via ORAL
  Filled 2017-07-12: qty 30

## 2017-07-12 MED ORDER — ALUM & MAG HYDROXIDE-SIMETH 200-200-20 MG/5ML PO SUSP: 30.0000 mL | ORAL | Status: DC | PRN

## 2017-07-12 MED ORDER — FLUOXETINE HCL 20 MG PO TABS
20.0000 mg | ORAL_TABLET | Freq: Every day | ORAL | Status: DC
  Administered 2017-07-12: 20 mg via ORAL
  Filled 2017-07-12 (×4): qty 1

## 2017-07-12 NOTE — Progress Notes (Signed)
Berta MinorJohn C Sokolov is a 21 y.o. male voluntarily admitted for paranoia, visual hallucinations, erratic behavior [facility].  Required Haldol from EMS before transported to [facility] for erratic and physical behavior.  [facility], denies AVH at this time, calm demeanor, speech clear and articulate.  Denies SI, denies history of suicidality.  Identifies depression and anxiety as main issue, believes marijuana use of 1 gram daily, and stress from school, and poor sleep has  contributed to psychotic episode, and reports feeling more clear now after [facility].  [facility] receiving Ativan taper and no antipsychotics.  On Lamictal x1 year for mood disorder, depression, started on Prozac 2 weeks ago, currently taking 20mg .  Medical and surgical history reviewed and noted below.  Basic search of patient completed with skin check completed.  Belongings reviewed and noted on belongings record.  Oriented to unit and rules, consents and treatment agreement reviewed with patient and signed.   No Known Allergies Past Medical History:  Diagnosis Date  . Depression    Past Surgical History:  Procedure Laterality Date  . HERNIA REPAIR     baby  . TESTICLE SURGERY     cryptochordism as a Development worker, international aidbaby

## 2017-07-12 NOTE — Progress Notes (Signed)
Did not attend group 

## 2017-07-12 NOTE — ED Notes (Signed)
[facility] on unit to transfer pt to [facility] Adult unit per MD order. Personal property given to [facility] for transfer. Pt signed e-signature. Ambulatory off unit.  

## 2017-07-12 NOTE — BH Assessment (Signed)
[facility] Assessment Progress Note   Clinician informed by Hassie BruceKim, AC that [facility].  Tawni LevyJameson Lord, NP had recommended inpatient care.  Dr. Jama Flavorsobos will be the attending.  Room assignment 506-2 and pt can come over after 08:00.

## 2017-07-12 NOTE — Tx Team (Signed)
Initial Treatment Plan 07/12/2017 1:05 PM Kyle MinorJohn C Thorpe [id number]    PATIENT STRESSORS: Educational concerns Substance abuse   PATIENT STRENGTHS: Ability for insight Average or above average intelligence Capable of independent living Communication skills General fund of knowledge   PATIENT IDENTIFIED PROBLEMS: "Stress management"  "Anxiety management"                   DISCHARGE CRITERIA:  Ability to meet basic life and health needs Adequate post-discharge living arrangements Improved stabilization in mood, thinking, and/or behavior  PRELIMINARY DISCHARGE PLAN: Outpatient therapy  PATIENT/FAMILY INVOLVEMENT: This treatment plan has been presented to and reviewed with the patient, Kyle MinorJohn C Mcmahon.  The patient and family have been given the opportunity to ask questions and make suggestions.  Lindajo Royalaniel P Eytan Carrigan, RN 07/12/2017, 1:05 PM

## 2017-07-12 NOTE — ED Notes (Signed)
Visitor at bedside.

## 2017-07-13 DIAGNOSIS — F19959 Other psychoactive substance use, unspecified with psychoactive substance-induced psychotic disorder, unspecified: Principal | ICD-10-CM

## 2017-07-13 DIAGNOSIS — F29 Unspecified psychosis not due to a substance or known physiological condition: Secondary | ICD-10-CM

## 2017-07-13 DIAGNOSIS — F22 Delusional disorders: Secondary | ICD-10-CM

## 2017-07-13 DIAGNOSIS — Z818 Family history of other mental and behavioral disorders: Secondary | ICD-10-CM

## 2017-07-13 DIAGNOSIS — R4689 Other symptoms and signs involving appearance and behavior: Secondary | ICD-10-CM

## 2017-07-13 LAB — CBG MONITORING, ED: GLUCOSE-CAPILLARY: 141 mg/dL — AB (ref 65–99)

## 2017-07-13 MED ORDER — ARIPIPRAZOLE 5 MG PO TABS
5.0000 mg | ORAL_TABLET | Freq: Every day | ORAL | Status: DC
  Administered 2017-07-13 – 2017-07-14 (×2): 5 mg via ORAL
  Filled 2017-07-13 (×3): qty 1

## 2017-07-13 NOTE — Progress Notes (Signed)
D    Pt has been in his room most of the shift   He has had no complaints   He is guarded and isolates   And does not socialize with others    Pt did not require medications this evening A   Verbal support given   Medications offered   Q 15 min checks   R   Pt is safe at present time

## 2017-07-13 NOTE — Progress Notes (Signed)
Pt did not attend wrap-up group   

## 2017-07-13 NOTE — Progress Notes (Signed)
DAR Note  Pt observed resting in room with eyes closed. Pt at the time of assessment denied any anxiety, depression, pain, HI, or SI; "my family was here; I feel better." Pt made minimal eye contact. Pt refused to participate during assessment. Pt does not look to be in any acute distress. Pt was med compliant. Support, encouragement, and safe environment provided.  15-minute safety checks continue. Pt did not attend wrap up group.

## 2017-07-13 NOTE — Plan of Care (Signed)
Problem: Activity: Goal: Interest or engagement in activities will improve Outcome: Progressing Patient is mostly reclusive to his room, and comes out for medications and meals, however, he is calm/cooperative with care. Goal: Sleeping patterns will improve Outcome: Progressing Patient reports that he had a good night's sleep and reports his appetite as good.  Comments: Patient denies SI/HI/AVH, affect is blunted, pt appears depressed, took all meds as scheduled. Patient denies pain, reports his mood as "fair", and reports his appetite as good.  Q15 minute safety checks in place, will continue to monitor.

## 2017-07-13 NOTE — [facility] Suicide Risk Assessment (Signed)
[facility] INPATIENT:  Family/Significant Other Suicide Prevention Education  Suicide Prevention Education:  Education Completed; Kyle Mcmahon, [phone] (Mother) has been identified by the patient as the family member/significant other with whom the patient will be residing, and identified as the person(s) who will aid the patient in the event of a mental health crisis (suicidal ideations/suicide attempt).  With written consent from the patient, the family member/significant other has been provided the following suicide prevention education, prior to the and/or following the discharge of the patient.  The suicide prevention education provided includes the following:  Suicide risk factors  Suicide prevention and interventions  National Suicide Hotline telephone number  [facility] assessment telephone number  [facility] Emergency Assistance 911  [facility] and/or Residential Mobile Crisis Unit telephone number  Request made of family/significant other to:  Remove weapons (e.g., guns, rifles, knives), all items previously/currently identified as safety concern.    Remove drugs/medications (over-the-counter, prescriptions, illicit drugs), all items previously/currently identified as a safety concern.  The family member/significant other verbalizes understanding of the suicide prevention education information provided.  The family member/significant other agrees to remove the items of safety concern listed above.  Kyle Mcmahon stated that Kyle Mcmahon is a "perfectionist" and does not take disappointment well.  She states that he feels a lot of guilt when he believes that he is disappointing her.  She also states that she would like Kyle Mcmahon to learn some coping skills and education about Marijuana.      Kyle Mcmahon 07/13/2017, 11:28 AM

## 2017-07-13 NOTE — [facility] Suicide Risk Assessment (Signed)
[facility] INPATIENT:  Family/Significant Other Suicide Prevention Education  Suicide Prevention Education:  Patient Refusal for Family/Significant Other Suicide Prevention Education: The patient Kyle Mcmahon has refused to provide written consent for family/significant other to be provided Family/Significant Other Suicide Prevention Education during admission and/or prior to discharge.  Physician notified.  Metro Kungngel M Freeda Spivey 07/13/2017, 9:33 AM
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
## 2017-07-13 NOTE — Tx Team (Signed)
Interdisciplinary Treatment and Diagnostic Plan Update  07/13/2017 Time of Session: 9:33 AM  Kyle Mcmahon MRN: [id number]  Principal Diagnosis: Psychosis Secondary Diagnoses: Active Problems:   Psychosis (HCC)   Current Medications:  Current Facility-Administered Medications  Medication Dose Route Frequency Provider Last Rate Last Dose  . acetaminophen (TYLENOL) tablet 650 mg  650 mg Oral Q6H PRN Laveda Abbe, NP      . alum & mag hydroxide-simeth (MAALOX/MYLANTA) 200-200-20 MG/5ML suspension 30 mL  30 mL Oral Q4H PRN Laveda Abbe, NP      . FLUoxetine (PROZAC) capsule 20 mg  20 mg Oral Daily Cobos, Rockey Situ, MD   20 mg at 07/13/17 0807  . hydrOXYzine (ATARAX/VISTARIL) tablet 25 mg  25 mg Oral TID Laveda Abbe, NP   25 mg at 07/13/17 4098  . lamoTRIgine (LAMICTAL) tablet 150 mg  150 mg Oral QHS Micheal Likens, MD   150 mg at 07/12/17 2125  . magnesium hydroxide (MILK OF MAGNESIA) suspension 30 mL  30 mL Oral Daily PRN Laveda Abbe, NP      . polyethylene glycol (MIRALAX / GLYCOLAX) packet 17 g  17 g Oral Daily PRN Cobos, Rockey Situ, MD      . traZODone (DESYREL) tablet 50 mg  50 mg Oral QHS PRN Laveda Abbe, NP        PTA Medications: Prescriptions Prior to Admission  Medication Sig Dispense Refill Last Dose  . docusate sodium (COLACE) 100 MG capsule Take 1 capsule (100 mg total) by mouth every 12 (twelve) hours. (Patient not taking: Reported on 07/10/2017) 30 capsule 0 Not Taking at Unknown time  . FLUoxetine (PROZAC) 20 MG tablet Take 20 mg by mouth daily.   07/09/2017 at Unknown time  . lamoTRIgine (LAMICTAL) 100 MG tablet Take 1 tablet (100 mg total) by mouth daily. Prescribed by psychiatrist at [facility] (Patient taking differently: Take 150 mg by mouth at bedtime. Prescribed by psychiatrist at [facility]) 30 tablet 0 07/09/2017 at Unknown time  . polyethylene glycol powder (GLYCOLAX/MIRALAX) powder Take 17 g by mouth 2 (two) times  daily. Until daily soft stools  OTC (Patient not taking: Reported on 07/10/2017) 225 g 0 Not Taking at Unknown time    Treatment Modalities: Medication Management, Group therapy, Case management,  1 to 1 session with clinician, Psychoeducation, Recreational therapy.  Patient Stressors: Educational concerns Substance abuse  Patient Strengths: Ability for insight Average or above average intelligence Capable of independent living Wellsite geologist fund of knowledge   Physician Treatment Plan for Primary Diagnosis: Psychosis Long Term Goal(s): Improvement in symptoms so as ready for discharge  Short Term Goals:    Medication Management: Evaluate patient's response, side effects, and tolerance of medication regimen.  Therapeutic Interventions: 1 to 1 sessions, Unit Group sessions and Medication administration.  Evaluation of Outcomes: Progressing  Physician Treatment Plan for Secondary Diagnosis: Active Problems:   Psychosis (HCC)  Long Term Goal(s): Improvement in symptoms so as ready for discharge  Short Term Goals:    Medication Management: Evaluate patient's response, side effects, and tolerance of medication regimen.  Therapeutic Interventions: 1 to 1 sessions, Unit Group sessions and Medication administration.  Evaluation of Outcomes: Progressing   RN Treatment Plan for Primary Diagnosis: Psychosis Long Term Goal(s): Knowledge of disease and therapeutic regimen to maintain health will improve  Short Term Goals: Ability to participate in decision making will improve, Ability to identify and develop effective coping behaviors will improve and Compliance with prescribed  medications will improve  Medication Management: RN will administer medications as ordered by provider, will assess and evaluate patient's response and provide education to patient for prescribed medication. RN will report any adverse and/or side effects to prescribing provider.  Therapeutic  Interventions: 1 on 1 counseling sessions, Psychoeducation, Medication administration, Evaluate responses to treatment, Monitor vital signs and CBGs as ordered, Perform/monitor CIWA, COWS, AIMS and Fall Risk screenings as ordered, Perform wound care treatments as ordered.  Evaluation of Outcomes: Progressing   LCSW Treatment Plan for Primary Diagnosis: Psychosis Long Term Goal(s): Safe transition to appropriate next level of care at discharge, Engage patient in therapeutic group addressing interpersonal concerns.  Short Term Goals: Engage patient in aftercare planning with referrals and resources, Increase social support, Identify triggers associated with mental health/substance abuse issues and Increase skills for wellness and recovery  Therapeutic Interventions: Assess for all discharge needs, 1 to 1 time with Social worker, Explore available resources and support systems, Assess for adequacy in community support network, Educate family and significant other(s) on suicide prevention, Complete Psychosocial Assessment, Interpersonal group therapy.  Evaluation of Outcomes: Progressing   Progress in Treatment: Attending groups: No Participating in groups: No  Taking medication as prescribed: Yes Toleration of medication: Yes, no side effects reported at this time Family/Significant other contact made: No Patient understands diagnosis: Yes AEB asking for help with learning coping skills for depression and anxiety  Discussing patient identified problems/goals with staff: Yes Medical problems stabilized or resolved: Yes Denies suicidal/homicidal ideation: Yes Issues/concerns per patient self-inventory: None Other: N/A  New problem(s) identified: None identified at this time.   New Short Term/Long Term Goal(s): Pt wants help with "Learning to cope with depression and anxiety".   Discharge Plan or Barriers: Upon discharge pt will [facility] [employer] [facility]  [place] [facility], where he lives with one roommate.   Reason for Continuation of Hospitalization: Anxiety Depression Hallucinations Medication stabilization   Estimated Length of Stay: 07/18/17  Attendees: Patient: Kyle DuraJohn Mcmahon  07/13/2017  9:33 AM  Physician: Jolyne Loahristopher Rainville, MD 07/13/2017  9:33 AM  Nursing: Estella HuskElizabeth Awofabeju, RN 07/13/2017  9:33 AM  RN Care Manager: Onnie BoerJennifer Clark, RN 07/13/2017  9:33 AM  Social Worker: Richelle Itood North, LCSW; Melba CoonAngel Deshea Pooley, Social Work Intern 07/13/2017  9:33 AM  Recreational Therapist: Caroll RancherMarjette Lindsay, LRT 07/13/2017  9:33 AM  Other: Tomasita Morrowelora Sutton, P4CC 07/13/2017  9:33 AM  Other:  07/13/2017  9:33 AM  Other: 07/13/2017  9:33 AM    Scribe for Treatment Team: Aram BeechamAngel M Simonne Boulos, Student-Social Work 07/13/2017 9:33 AM

## 2017-07-13 NOTE — H&P (Signed)
Psychiatric Admission Assessment Adult  Patient Identification: Kyle Mcmahon C Mcmahon MRN:  [id number] Date of Evaluation:  07/13/2017 Chief Complaint:  mdd Principal Diagnosis: Psychosis (HCC) Diagnosis:   Patient Active Problem List   Diagnosis Date Noted  . 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 Behavior change due to substance use [R46.89] 07/11/2017  . Abscess [L02.91] 02/17/2017  . Preventative health care [Z00.00] 08/05/2014   History of Present Illness:   Kyle Mcmahon is a 21 y/o M with history of treatment for depression who presents on IVC with worsening symptoms of psychosis. Pt has recent onset of being withdrawn, nearly mute, amotivated, apathetic, paranoid, and with symptoms of depersonalization/derealization. As per ED report, pt had been experiencing increased latency, mutism, negativism, and staring spells. He was recently started on prozac on 10/21 and he had been continued on lamictal which was started last spring when pt was started on treatment [facility]. Pt has stressors of struggling with his coursework. He is not able to identify other stressors in his life. He acknowledges using alcohol and cannabis, which he thinks affected his sleep schedule. He describes using about 1 gram per day of cannabis and using more over the summer, which he notes caused him to stay up late and eat late. He reports drinking about 4 drinks on about 3-4 separate occassions per month. He denies other illicit substance use including synthetic cannabinoids, though his mother reports concern that he has been using synthetic cannabinoids.He reports that his current sleep pattern is normal. He denies anhedonia, guilty feelings, low energy, and poor concentration. He denies SI/HI/AH/VH. He endorses some paranoia that he felt others were out to get him while at the ED. He endorses some recent feelings of depersonalization feeling that he is not real and derealization feeling that the world  around him is not real. He endorses some apathy and amotivation, but otherwise he denies mood and anxiety symptoms. He denies symptoms of mania and denies any history of manic episode. He denies history of OCD or PTSD symptoms. Discussed with patient about treatment options and he is open to trial of abilify. He is not sure if lamictal has been helpful for him and he agrees to discontinue that medication. Pt had no further questions, comments, or concerns.    Associated Signs/Symptoms: Depression Symptoms:  depressed mood, (Hypo) Manic Symptoms:  denies Anxiety Symptoms:  denies Psychotic Symptoms:  Paranoia, derealization, depersonalization PTSD Symptoms: Negative Total Time spent with patient: 45 minutes  Past Psychiatric History:  - Previous treatment for depression on outpatient basis - No previous inpatient hospitalizations - Outpatient provider [facility] - No previous suicide attempts  Is the patient at risk to self? Yes.    Has the patient been a risk to self in the past 6 months? Yes.    Has the patient been a risk to self within the distant past? No.  Is the patient a risk to others? No.  Has the patient been a risk to others in the past 6 months? No.  Has the patient been a risk to others within the distant past? No.   Prior Inpatient Therapy:   Prior Outpatient Therapy:    Alcohol Screening: 1. How often do you have a drink containing alcohol?: 2 to 4 times a month 2. How many drinks containing alcohol do you have on a typical day when you are drinking?: 3 or 4 3. How often do you have six or more drinks  on one occasion?: Less than monthly Preliminary Score: 2 4. How often during the last year have you found that you were not able to stop drinking once you had started?: Less than monthly 5. How often during the last year have you failed to do what was normally expected from you becasue of drinking?: Less than monthly 6. How often during  the last year have you needed a first drink in the morning to get yourself going after a heavy drinking session?: Never 7. How often during the last year have you had a feeling of guilt of remorse after drinking?: Less than monthly 8. How often during the last year have you been unable to remember what happened the night before because you had been drinking?: Less than monthly 9. Have you or someone else been injured as a result of your drinking?: No 10. Has a relative or friend or a doctor or another health worker been concerned about your drinking or suggested you cut down?: Yes, during the last year Alcohol Use Disorder Identification Test Final Score (AUDIT): 12 Brief Intervention: Yes Substance Abuse History in the last 12 months:  Yes.   Consequences of Substance Abuse: Medical Consequences:  increased withdrawal, decreased motivation Previous Psychotropic Medications: Yes  Psychological Evaluations: No  Past Medical History:  Past Medical History:  Diagnosis Date  . Depression     Past Surgical History:  Procedure Laterality Date  . HERNIA REPAIR     baby  . 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 Bipolar disorder Mother    Family Psychiatric  History: hx of bipolar in mother and maternal grandmother Tobacco Screening: Have you used any form of tobacco in the last 30 days? (Cigarettes, Smokeless Tobacco, Cigars, and/or Pipes): No Social History:  History  Alcohol Use  . Yes    Comment: 3-4 drinks 2-4 x per month     History  Drug Use    Comment: Pt endorsed Marijuana use; UDS was clear    Additional Social History: Marital status: Single Are you sexually active?: No What is your sexual orientation?: Heterosexual  Does patient have children?: No    Pain Medications: See MAR Prescriptions: See MAR Over the Counter: See MAR History of alcohol / drug use?: Yes (Marijuana, daily 1 gram)                     Allergies:  No Known Allergies Lab Results: No results found for this or any previous visit (from the past 48 hour(s)).  Blood Alcohol level:  Lab Results  Component Value Date   ETH <10 07/10/2017    Metabolic Disorder Labs:  No results found for: HGBA1C, MPG No results found for: PROLACTIN No results found for: CHOL, TRIG, HDL, CHOLHDL, VLDL, LDLCALC  Current Medications: Current Facility-Administered Medications  Medication Dose Route Frequency Provider Last Rate Last Dose  . acetaminophen (TYLENOL) tablet 650 mg  650 mg Oral Q6H PRN Laveda Abbe, NP      . alum & mag hydroxide-simeth (MAALOX/MYLANTA) 200-200-20 MG/5ML suspension 30 mL  30 mL Oral Q4H PRN Laveda Abbe, NP      . ARIPiprazole (ABILIFY) tablet 5 mg  5 mg Oral Daily Micheal Likens, MD      . FLUoxetine (PROZAC) capsule 20 mg  20 mg Oral Daily Cobos, Rockey Situ, MD   20 mg at 07/13/17  2130  . hydrOXYzine (ATARAX/VISTARIL) tablet 25 mg  25 mg Oral TID Laveda Abbe, NP   25 mg at 07/13/17 8657  . magnesium hydroxide (MILK OF MAGNESIA) suspension 30 mL  30 mL Oral Daily PRN Laveda Abbe, NP      . polyethylene glycol (MIRALAX / GLYCOLAX) packet 17 g  17 g Oral Daily PRN Cobos, Rockey Situ, MD      . traZODone (DESYREL) tablet 50 mg  50 mg Oral QHS PRN Laveda Abbe, NP       PTA Medications: Prescriptions Prior to Admission  Medication Sig Dispense Refill Last Dose  . docusate sodium (COLACE) 100 MG capsule Take 1 capsule (100 mg total) by mouth every 12 (twelve) hours. (Patient not taking: Reported on 07/10/2017) 30 capsule 0 Not Taking at Unknown time  . FLUoxetine (PROZAC) 20 MG tablet Take 20 mg by mouth daily.   07/09/2017 at Unknown time  . lamoTRIgine (LAMICTAL) 100 MG tablet Take 1 tablet (100 mg total) by mouth daily. Prescribed by psychiatrist at [facility] (Patient taking differently: Take 150 mg by mouth at bedtime. Prescribed by  psychiatrist at [facility]) 30 tablet 0 07/09/2017 at Unknown time  . polyethylene glycol powder (GLYCOLAX/MIRALAX) powder Take 17 g by mouth 2 (two) times daily. Until daily soft stools  OTC (Patient not taking: Reported on 07/10/2017) 225 g 0 Not Taking at Unknown time    Musculoskeletal: Strength & Muscle Tone: within normal limits Gait & Station: normal Patient leans: N/A  Psychiatric Specialty Exam: Physical Exam  Nursing note and vitals reviewed.   Review of Systems  Constitutional: Negative for chills and fever.  Respiratory: Negative for cough and shortness of breath.   Cardiovascular: Negative for chest pain and palpitations.  Gastrointestinal: Negative for abdominal pain, heartburn, nausea and vomiting.  Psychiatric/Behavioral: Positive for substance abuse. Negative for depression, hallucinations and suicidal ideas.    Blood pressure 122/77, pulse 93, temperature 97.9 F (36.6 C), temperature source Oral, resp. rate 20, height 6\' 1"  (1.854 m), weight 98.9 kg (218 lb), SpO2 99 %.Body mass index is 28.76 kg/m.  General Appearance: Casual and Fairly Groomed  Eye Contact:  Good  Speech:  Clear and Coherent and Slow  Volume:  Decreased  Mood:  Euthymic  Affect:  Congruent, Constricted and Flat  Thought Process:  Goal Directed  Orientation:  Full (Time, Place, and Person)  Thought Content:  Logical, Paranoid Ideation and derealization, depersonalization  Suicidal Thoughts:  No  Homicidal Thoughts:  No  Memory:  Immediate;   Good Recent;   Good Remote;   Good  Judgement:  Fair  Insight:  Fair  Psychomotor Activity:  Normal  Concentration:  Concentration: Good  Recall:  Good  Fund of Knowledge:  Good  Language:  Good  Akathisia:  No  Handed:    AIMS (if indicated):     Assets:  Communication Skills Desire for Improvement Financial Resources/Insurance Housing Resilience Social Support  ADL's:  Intact  Cognition:  WNL  Sleep:  Number of Hours: 6.75    Treatment  Plan Summary: Daily contact with patient to assess and evaluate symptoms and progress in treatment and Medication management  Observation Level/Precautions:  15 minute checks  Laboratory:  CBC Chemistry Profile HbAIC UDS, lipid panel, prolact  Psychotherapy:  Encourage participation in groups and therapeutic milieu  Medications: DC lamictal, start Abilify 5mg  qDay, continue prozac 20mg  qDay    Consultations:    Discharge Concerns:    Estimated LOS: 5-7 days  Other:     Physician Treatment Plan for Primary Diagnosis: Psychosis (HCC) Long Term Goal(s): Improvement in symptoms so as ready for discharge  Short Term Goals: Ability to identify and develop effective coping behaviors will improve and Compliance with prescribed medications will improve  Physician Treatment Plan for Secondary Diagnosis: Principal Problem:   Psychosis (HCC)  Long Term Goal(s): Improvement in symptoms so as ready for discharge  Short Term Goals: Ability to identify and develop effective coping behaviors will improve and Compliance with prescribed medications will improve  I certify that inpatient services furnished can reasonably be expected to improve the patient's condition.    Micheal Likens, MD 10/31/201812:16 PM

## 2017-07-13 NOTE — Progress Notes (Signed)
Patient ID: Kyle MinorJohn C Mcmahon, male   DOB: 06-20-1996, 21 y.o.   MRN: [id number] Patient was minimally interactive in the milieu today, spent small amounts of time in the day room interacting with staff and peers.  Affect is blunted, pt denies having any concerns, and went to the cafeteria for breakfast, lunch and dinner.  Patient denies any pain, denies SI/HI/AVH, Q15 minute checks for safety maintained through shift.  Will report to oncoming shift.

## 2017-07-13 NOTE — B[facility] Group Notes (Signed)
LCSW Group Therapy Note  07/13/2017 1:15pm  Type of Therapy/Topic:  Group Therapy:  Balance in Life  Participation Level:  Active  Description of Group:    This group will address the concept of balance and how it feels and looks when one is unbalanced. Patients will be encouraged to process areas in their lives that are out of balance and identify reasons for remaining unbalanced. Facilitators will guide patients in utilizing problem-solving interventions to address and correct the stressor making their life unbalanced. Understanding and applying boundaries will be explored and addressed for obtaining and maintaining a balanced life. Patients will be encouraged to explore ways to assertively make their unbalanced needs known to significant others in their lives, using other group members and facilitator for support and feedback.  Therapeutic Goals: 1. Patient will identify two or more emotions or situations they have that consume much of in their lives. 2. Patient will identify signs/triggers that life has become out of balance:  3. Patient will identify two ways to set boundaries in order to achieve balance in their lives:  4. Patient will demonstrate ability to communicate their needs through discussion and/or role plays  Summary of Patient Progress:   Kyle RuizJohn attended group and was there the entire time.  The emotion that he chose was anxiety.  He knows when he is feeling this emotion because his palms get sweaty, his heart starts to race, and his thoughts become repetitive.  He often uses his social media when he wants to feel better because it helps him to connect to other people.   He also uses some forms of deep breathing to help him relax. He states that he enjoyed the deep breathing but has been unsuccessful at it for more than ten minutes at a time.  He feels that with more practice he will get better with this technique.  He had no signs of psychosis during group.     Therapeutic  Modalities:   Cognitive Behavioral Therapy Solution-Focused Therapy Assertiveness Training  Aram Beechamngel M Liem Copenhaver, Student-Social Work 07/13/2017 1:29 PM

## 2017-07-13 NOTE — [facility] Suicide Risk Assessment (Signed)
[facility]   Nursing information obtained from:  Patient Demographic factors:  Male Current Mental Status:  NA Loss Factors:  NA Historical Factors:  NA Risk Reduction Factors:  Positive coping skills or problem solving skills, Employed, Living with another person, especially a relative, Positive social support  Total Time spent with patient: 45 minutes Principal Problem: Psychosis (HCC) Diagnosis:   Patient Active Problem List   Diagnosis Date Noted  . 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 Behavior change due to substance use [R46.89] 07/11/2017  . Abscess [L02.91] 02/17/2017  . Preventative health care [Z00.00] 08/05/2014   Subjective Data:  See H&P for full HPI - Kyle Mcmahon is a 21 y/o M with history of depression who presented on IVC with concern for worsening symptoms of psychosis. Pt's mother completed IVC with concern that pt has been more withdrawn, nearly mute, and had recent personality change of decreased motivation with school. Pt is flat and withdrawn on interview. He denies SI/HI/AH/VH but he endorses recent depersonalization and derealization. He had some paranoia while in the ED that he was being watched. He is in agreement to discontinue lamictal and start trial of abilify.    Continued Clinical Symptoms:  Alcohol Use Disorder Identification Test Final Score (AUDIT): 12 The "Alcohol Use Disorders Identification Test", Guidelines for Use in Primary Care, Second Edition.  World Science writerHealth Organization [facility](WHO). Score between 0-7:  no or low risk or alcohol related problems. Score between 8-15:  moderate risk of alcohol related problems. Score between 16-19:  high risk of alcohol related problems. Score 20 or above:  warrants further diagnostic evaluation for alcohol dependence and treatment.   CLINICAL FACTORS:   Depression:   Severe   Musculoskeletal: Strength & Muscle Tone: within normal limits Gait & Station: normal Patient leans:  N/A  Psychiatric Specialty Exam: Physical Exam  Nursing note and vitals reviewed.   Review of Systems  Constitutional: Negative for chills and fever.  Respiratory: Negative for cough.   Cardiovascular: Negative for chest pain.  Gastrointestinal: Negative for abdominal pain, heartburn, nausea and vomiting.  Psychiatric/Behavioral: Positive for depression and substance abuse. Negative for suicidal ideas.    Blood pressure 122/77, pulse 93, temperature 97.9 F (36.6 C), temperature source Oral, resp. rate 20, height 6\' 1"  (1.854 m), weight 98.9 kg (218 lb), SpO2 99 %.Body mass index is 28.76 kg/m.  General Appearance: Casual and Well Groomed  Eye Contact:  Fair  Speech:  Clear and Coherent and Slow  Volume:  Decreased  Mood:  Euthymic  Affect:  Congruent and Flat  Thought Process:  Goal Directed and Descriptions of Associations: Intact  Orientation:  Full (Time, Place, and Person)  Thought Content:  derealization, depersonalization  Suicidal Thoughts:  No  Homicidal Thoughts:  No  Memory:  Immediate;   Good Recent;   Good Remote;   Good  Judgement:  Good  Insight:  Good  Psychomotor Activity:  Normal  Concentration:  Concentration: Good  Recall:  Good  Fund of Knowledge:  Good  Language:  Good  Akathisia:  No  Handed:    AIMS (if indicated):     Assets:  Communication Skills Desire for Improvement Housing Resilience Social Support  ADL's:  Intact  Cognition:  WNL  Sleep:  Number of Hours: 6.75      COGNITIVE FEATURES THAT CONTRIBUTE TO RISK:  None    SUICIDE RISK:   Minimal: No identifiable suicidal ideation.  Patients presenting with no risk factors but with morbid  ruminations; may be classified as minimal risk based on the severity of the depressive symptoms  PLAN OF CARE:  - Admit to inpatient psychiatry unit - Psychosis  - Start abilify 5mg  qDay  - DC lamictal  - Continue prozac 20mg  qDay - Encourage participation in groups and therapeutic milieu -  Discharge planning will be ongoing - Increase collateral information  I certify that inpatient services furnished can reasonably be expected to improve the patient's condition.   Micheal Likens, MD 07/13/2017, 1:12 PM

## 2017-07-14 MED ORDER — RISPERIDONE 1 MG PO TABS
1.0000 mg | ORAL_TABLET | Freq: Every day | ORAL | Status: DC
  Administered 2017-07-14: 1 mg via ORAL
  Filled 2017-07-14 (×2): qty 1

## 2017-07-14 MED ORDER — BENZTROPINE MESYLATE 0.5 MG PO TABS
0.5000 mg | ORAL_TABLET | Freq: Two times a day (BID) | ORAL | Status: DC | PRN
  Administered 2017-07-14: 0.5 mg via ORAL
  Filled 2017-07-14: qty 1

## 2017-07-14 NOTE — BHH Group Notes (Signed)
LCSW Group Therapy Note   07/14/2017 1:15pm   Type of Therapy and Topic:  Group Therapy:  Positive Affirmations   Participation Level:  Did Not Attend  Description of Group: This group addressed positive affirmation toward self and others. Patients went around the room and identified two positive things about themselves and two positive things about a peer in the room. Patients reflected on how it felt to share something positive with others, to identify positive things about themselves, and to hear positive things from others. Patients were encouraged to have a daily reflection of positive characteristics or circumstances.  Therapeutic Goals 1. Patient will verbalize two of their positive qualities 2. Patient will demonstrate empathy for others by stating two positive qualities about a peer in the group 3. Patient will verbalize their feelings when voicing positive self affirmations and when voicing positive affirmations of others 4. Patients will discuss the potential positive impact on their wellness/recovery of focusing on positive traits of self and others. Summary of Patient Progress:    Therapeutic Modalities Cognitive Behavioral Therapy Motivational Interviewing  Kyle Mcmahon [name], Student-Social Work 07/14/2017 1:50 PM

## 2017-07-14 NOTE — Progress Notes (Signed)
DAR NOTE: Patient presents with anxious affect and depressed mood.  Reports poor night sleep.  Patient reminded to ask for sleep aid.  Described energy level as low and concentration as good.  Denies pain, auditory and visual hallucinations.  Rates depression at 5, hopelessness at 4, and anxiety at 4.  Maintained on routine safety checks.  Medications given as prescribed.  Support and encouragement offered as needed.  States goal for today is "working on depression and anxiety."  Patient with minimal interaction.

## 2017-07-14 NOTE — Progress Notes (Signed)
[facility] MD Progress Note  07/14/2017 3:08 PM TRACY KINNER  MRN:  [id number] Subjective:   Kyle Mcmahon is a 21 y/o M who was admitted with worsening symptoms of psychosis. He was initially started on abilify upon admission. Today he reports that he feels "a little out of it." He describes feeling restless, like he cannot sit still. He also endorses some paranoia of feeling that others have been watching him. He endorses some racing thoughts. He denies SI/HI/AH/VH. He had some poor sleep with initial and middle insomnia. His appetite has been good. Discussed with patient about alternative options to address his presenting symptoms, and pt was in agreement to attempt trial of risperdal. Pt had no other questions, comments, or concerns.  Principal Problem: Psychosis (HCC) Diagnosis:   Patient Active Problem List   Diagnosis Date Noted  . 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 Behavior change due to substance use [R46.89] 07/11/2017  . Abscess [L02.91] 02/17/2017  . Preventative health care [Z00.00] 08/05/2014   Total Time spent with patient: 30 minutes  Past Psychiatric History: see H&P  Past Medical History:  Past Medical History:  Diagnosis Date  . Depression     Past Surgical History:  Procedure Laterality Date  . HERNIA REPAIR     baby  . 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 Bipolar disorder Mother    Family Psychiatric  History: see H&P Social History:  History  Alcohol Use  . Yes    Comment: 3-4 drinks 2-4 x per month     History  Drug Use    Comment: Pt endorsed Marijuana use; UDS was clear    Social History   Social History  . Marital status: Single    Spouse name: N/A  . Number of children: N/A  . Years of education: N/A   Social History Main Topics  . Smoking status: Never Smoker  . Smokeless tobacco: Never Used  . Alcohol use Yes     Comment: 3-4 drinks 2-4 x per month  . Drug  use: Yes     Comment: Pt endorsed Marijuana use; UDS was clear  . Sexual activity: Not Asked   Other Topics Concern  . None   Social History Narrative   Lives with mother sister and maternal aunt.   Additional Social History:    Pain Medications: See MAR Prescriptions: See MAR Over the Counter: See MAR History of alcohol / drug use?: Yes (Marijuana, daily 1 gram)                    Sleep: Good  Appetite:  Good  Current Medications: Current Facility-Administered Medications  Medication Dose Route Frequency Provider Last Rate Last Dose  . acetaminophen (TYLENOL) tablet 650 mg  650 mg Oral Q6H PRN Laveda Abbe, NP      . alum & mag hydroxide-simeth (MAALOX/MYLANTA) 200-200-20 MG/5ML suspension 30 mL  30 mL Oral Q4H PRN Laveda Abbe, NP      . benztropine (COGENTIN) tablet 0.5 mg  0.5 mg Oral BID PRN Micheal Likens, MD   0.5 mg at 07/14/17 1311  . FLUoxetine (PROZAC) capsule 20 mg  20 mg Oral Daily Cobos, Rockey Situ, MD   20 mg at 07/14/17 9629  . hydrOXYzine (ATARAX/VISTARIL) tablet 25 mg  25 mg Oral TID Laveda Abbe, NP   25 mg at 07/14/17 1211  .  magnesium hydroxide (MILK OF MAGNESIA) suspension 30 mL  30 mL Oral Daily PRN Laveda Abbe, NP      . polyethylene glycol (MIRALAX / GLYCOLAX) packet 17 g  17 g Oral Daily PRN Cobos, Fernando A, MD      . risperiDONE (RISPERDAL) tablet 1 mg  1 mg Oral QHS Micheal Likens, MD      . traZODone (DESYREL) tablet 50 mg  50 mg Oral QHS PRN Laveda Abbe, NP        Lab Results: No results found for this or any previous visit (from the past 48 hour(s)).  Blood Alcohol level:  Lab Results  Component Value Date   ETH <10 07/10/2017    Metabolic Disorder Labs: No results found for: HGBA1C, MPG No results found for: PROLACTIN No results found for: CHOL, TRIG, HDL, CHOLHDL, VLDL, LDLCALC  Physical Findings: AIMS: Facial and Oral Movements Muscles of Facial Expression:  None, normal Lips and Perioral Area: None, normal Jaw: None, normal Tongue: None, normal,Extremity Movements Upper (arms, wrists, hands, fingers): None, normal Lower (legs, knees, ankles, toes): None, normal, Trunk Movements Neck, shoulders, hips: None, normal, Overall Severity Severity of abnormal movements (highest score from questions above): None, normal Incapacitation due to abnormal movements: None, normal Patient's awareness of abnormal movements (rate only patient's report): No Awareness, Dental Status Current problems with teeth and/or dentures?: No Does patient usually wear dentures?: No  CIWA:    COWS:     Musculoskeletal: Strength & Muscle Tone: within normal limits Gait & Station: normal Patient leans: N/A  Psychiatric Specialty Exam: Physical Exam  Nursing note and vitals reviewed.   Review of Systems  Constitutional: Negative for chills and fever.  Respiratory: Negative for cough.   Cardiovascular: Negative for chest pain.  Gastrointestinal: Negative for abdominal pain, heartburn, nausea and vomiting.  Psychiatric/Behavioral: Negative for depression, hallucinations and suicidal ideas. The patient is not nervous/anxious.     Blood pressure (!) 146/83, pulse 86, temperature 98.3 F (36.8 C), resp. rate 18, height 6\' 1"  (1.854 m), weight 98.9 kg (218 lb), SpO2 99 %.Body mass index is 28.76 kg/m.  General Appearance: Casual and Well Groomed  Eye Contact:  Good  Speech:  Clear and Coherent and Slow  Volume:  Normal  Mood:  Anxious  Affect:  Congruent and Flat  Thought Process:  Goal Directed  Orientation:  Full (Time, Place, and Person)  Thought Content:  Paranoid Ideation  Suicidal Thoughts:  No  Homicidal Thoughts:  No  Memory:  Immediate;   Good Recent;   Good Remote;   Good  Judgement:  Fair  Insight:  Fair  Psychomotor Activity:  Normal  Concentration:  Concentration: Good  Recall:  Good  Fund of Knowledge:  Good  Language:  Good  Akathisia:  No   Handed:    AIMS (if indicated):     Assets:  Communication Skills Desire for Improvement Resilience Social Support  ADL's:  Intact  Cognition:  WNL  Sleep:  Number of Hours: 6.75     Treatment Plan Summary: Daily contact with patient to assess and evaluate symptoms and progress in treatment and Medication management. Pt reports restlessness and increased paranoia after trying abilify. He agrees to switch to risperdal. He will have cogentin available for any side effects of EPS.  - Continue inpatient hospitalization  - Psychosis, unspecified             - Discontinue abilify 5mg  qDay             -  Start Risperdal 1mg  qhs             - Continue prozac 20mg  qDay -EPS  -Start cogentin 0.5mg  BID prn EPS  - Encourage participation in groups and therapeutic milieu - Discharge planning will be ongoing - Increase collateral information  Micheal Likenshristopher T Preet Mangano, MD 07/14/2017, 3:08 PM

## 2017-07-14 NOTE — Progress Notes (Signed)
D    Pt has been in his room most of the shift   He has had no complaints   He is guarded and isolates    His socialization with others is limited   He did request a laxative this evening   A   Verbal support given   Medications offered   Q 15 min checks   R   Pt is safe at present time

## 2017-07-14 NOTE — Progress Notes (Signed)
Psychoeducational Group Note  Date:  07/14/2017 Time:  2130  Group Topic/Focus:  Wrap-Up Group:   The focus of this group is to help patients review their daily goal of treatment and discuss progress on daily workbooks.  Participation Level: Did Not Attend  Participation Quality:  Not Applicable  Affect:  Not Applicable  Cognitive:  Not Applicable  Insight:  Not Applicable  Engagement in Group: Not Applicable  Additional Comments:  The patient did not attend group this evening.   Gordy Goar S 07/14/2017, 9:30 PM

## 2017-07-15 ENCOUNTER — Ambulatory Visit (INDEPENDENT_AMBULATORY_CARE_PROVIDER_SITE_OTHER): Payer: BC Managed Care – PPO | Admitting: Internal Medicine

## 2017-07-15 ENCOUNTER — Encounter: Payer: Self-pay | Admitting: Internal Medicine

## 2017-07-15 VITALS — BP 112/64 | HR 95 | Temp 98.5°F | Ht 72.0 in | Wt 221.2 lb

## 2017-07-15 DIAGNOSIS — H6123 Impacted cerumen, bilateral: Secondary | ICD-10-CM

## 2017-07-15 MED ORDER — RISPERIDONE 1 MG PO TABS: 1.0000 mg | ORAL_TABLET | Freq: Every day | ORAL | 0 refills | Status: DC

## 2017-07-15 MED ORDER — HYDROXYZINE HCL 25 MG PO TABS: 25.0000 mg | ORAL_TABLET | Freq: Three times a day (TID) | ORAL | 0 refills | Status: DC | PRN

## 2017-07-15 MED ORDER — FLUOXETINE HCL 20 MG PO CAPS: 20.0000 mg | ORAL_CAPSULE | Freq: Every day | ORAL | 0 refills | Status: DC

## 2017-07-15 MED ORDER — BENZTROPINE MESYLATE 0.5 MG PO TABS: 0.5000 mg | ORAL_TABLET | Freq: Two times a day (BID) | ORAL | 0 refills | Status: DC | PRN

## 2017-07-15 MED ORDER — TRAZODONE HCL 50 MG PO TABS: 50.0000 mg | ORAL_TABLET | Freq: Every evening | ORAL | 0 refills | Status: DC | PRN

## 2017-07-15 MED ORDER — HYDROXYZINE HCL 25 MG PO TABS: 25.0000 mg | ORAL_TABLET | Freq: Three times a day (TID) | ORAL | Status: DC | PRN

## 2017-07-15 MED ORDER — POLYETHYLENE GLYCOL 3350 17 GM/SCOOP PO POWD: 17.0000 g | Freq: Two times a day (BID) | ORAL | 0 refills | Status: DC

## 2017-07-15 NOTE — [facility] Suicide Risk Assessment (Signed)
[facility]   Principal Problem: Psychosis [facility](HCC) Discharge Diagnoses:  Patient Active Problem List   Diagnosis Date Noted  . 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 Behavior change due to substance use [R46.89] 07/11/2017  . Abscess [L02.91] 02/17/2017  . Preventative health care [Z00.00] 08/05/2014    Total Time spent with patient: 30 minutes  Musculoskeletal: Strength & Muscle Tone: within normal limits Gait & Station: normal Patient leans: N/A  Psychiatric Specialty Exam: Review of Systems  Constitutional: Negative for chills and fever.  Respiratory: Negative for cough.   Cardiovascular: Negative for chest pain and palpitations.  Gastrointestinal: Negative for abdominal pain, heartburn, nausea and vomiting.  Psychiatric/Behavioral: Negative for depression, hallucinations and suicidal ideas. The patient is not nervous/anxious.     Blood pressure 114/63, pulse 99, temperature 98 F (36.7 C), temperature source Oral, resp. rate 16, height 6\' 1"  (1.854 m), weight 98.9 kg (218 lb), SpO2 99 %.Body mass index is 28.76 kg/m.  General Appearance: Casual and Fairly Groomed  Patent attorneyye Contact::  Good  Speech:  Clear and Coherent and Normal Rate  Volume:  Normal  Mood:  Euthymic  Affect:  Congruent and Constricted  Thought Process:  Coherent and Goal Directed  Orientation:  Full (Time, Place, and Person)  Thought Content:  Logical  Suicidal Thoughts:  No  Homicidal Thoughts:  No  Memory:  Immediate;   Good Recent;   Good Remote;   Good  Judgement:  Good  Insight:  Good  Psychomotor Activity:  Normal  Concentration:  Good  Recall:  Good  Fund of Knowledge:Good  Language: Good  Akathisia:  No  Handed:    AIMS (if indicated):     Assets:  ArchitectCommunication Skills Financial Resources/Insurance Housing Leisure Time Physical Health Resilience Social Support Vocational/Educational  Sleep:  Number of Hours: 6.25  Cognition: WNL  ADL's:  Intact   Mental  Status Per Nursing Assessment::   On Admission:  NA  Demographic Factors:  Male and Adolescent or young adult  Loss Factors: Financial problems/change in socioeconomic status  Historical Factors: NA  Risk Reduction Factors:   Living with another person, especially a relative, Positive social support, Positive therapeutic relationship and Positive coping skills or problem solving skills  Continued Clinical Symptoms:  Previous Psychiatric Diagnoses and Treatments  Cognitive Features That Contribute To Risk:  None    Suicide Risk:  Minimal: No identifiable suicidal ideation.  Patients presenting with no risk factors but with morbid ruminations; may be classified as minimal risk based on the severity of the depressive symptoms  Follow-up Information    [facility]2017.   Why:  Friday at 1:30PM with Mr Lerry LinerColucci Contact information: [id number]  [facility] [place] [id number]   [phone]  F: [phone]         Subjective data: Kyle Mcmahon is a 21 y/o M who was admitted with symptoms of psychosis including withdrawal, decreased verbalization, and catatonic appearing presentation. Concern was raised for possible exposure to synthetic cannabinoids. Pt was started on regimen of abilify which he did not tolerate due to Dominicaakithisia and anxiety, and then he was switched to risperdal, which he has been tolerating without difficulty or side effect. Today, pt reports that he is doing well overall and he feels ready for discharge. He denies SI/HI/AH/VH. He is sleeping well and his appetite is good. He has been in contact with his mother, Kyle Mcmahon,  and she was contacted for collateral information. Pt's mother shares that pt sounds to be at baseline and she has no safety concerns with his discharge. Pt plans to follow up with mental health provider at [facility]. Pt was counseled to avoid illicit substances including  cannabis and alcohol. Discussed with patient that his symptoms could be a result of use of synthetic cannabinoids or cannabis, and that he may have an underlying chronic, primary psychotic disorder which has the potential to be unmasked by further use of cannabinoids; pt verbalized good understanding. Pt was able to engage in safety planning including returning to [facility] or contacting emergency services if he feels unable to maintain his own safety. Pt had no further questions, comments, or concerns.  Plan Of Care/Follow-up recommendations:  - Psychosis, unspecified r/o substance induced psychotic disorder vs primary psychotic disorder - Continue Risperdal 1mg  qhs - Continue prozac 20mg  qDay  - Continue hydroxyzine 25mg  q8h prn anxiety  Activity:  as tolerated Diet:  normal Tests:  NA Other:  see above for DC plan  Micheal Likens, MD 07/15/2017, 10:09 AM

## 2017-07-15 NOTE — Progress Notes (Signed)
   Subjective:    Kyle Mcmahon - 21 y.o. male MRN [id number]  Date of birth: [birth year]  HPI  Kyle Mcmahon is here for muffled hearing bilaterally. Has been occurring for about 1 week. Reports history of needing to have ears irrigated frequently. Denies ear pain or ear drainage. Afebrile at home.    -  reports that he has never smoked. He has never used smokeless tobacco. - Review of Systems: Per HPI. - Past Medical History: Patient Active Problem List   Diagnosis Date Noted  . 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 Behavior change due to substance use 07/11/2017  . Abscess 02/17/2017  . Preventative health care 08/05/2014   - Medications: reviewed and updated   Objective:   Physical Exam BP 112/64   Pulse 95   Temp 98.5 F (36.9 C) (Oral)   Ht 6' (1.829 m)   Wt 221 lb 3.2 oz (100.3 kg)   SpO2 97%   BMI 30.00 kg/m  Gen: NAD, alert, cooperative with exam, well-appearing HEENT: impacted cerumen L>R, TMs visualized and normal after irrigation bilaterally      Assessment & Plan:   1. Bilateral impacted cerumen Resolved with irrigation. Recommend Debrox use.    Marcy Sirenatherine Wallace, D.O. 07/15/2017, 4:24 PM PGY-3, [facility]

## 2017-07-15 NOTE — Discharge Summary (Signed)
Physician Discharge Summary Note  Patient:  Kyle Mcmahon is an 21 y.o., male MRN:  [id number] DOB:  [birth year] Patient phone:  [phone] (home)  Patient address:   [address] [place] [address],   Total Time spent with patient: Greater than 30 minutes  Date of Admission:  07/12/2017 Date of Discharge: 07-15-17  Reason for Admission: Subtstance induced psychosis, mutism & paranoid.  Principal Problem: Substance-induced psychotic disorder [facility])  Discharge Diagnoses: Patient Active Problem List   Diagnosis Date Noted  . Substance-induced psychotic disorder [facility]) [F19.959] 07/12/2017    Priority: High  . Behavior change due to substance use [R46.89] 07/11/2017  . Abscess [L02.91] 02/17/2017  . Preventative health care [Z00.00] 08/05/2014   Past Psychiatric History: Psychosis.  Past Medical History:  Past Medical History:  Diagnosis Date  . Depression     Past Surgical History:  Procedure Laterality Date  . HERNIA REPAIR     baby  . 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 Bipolar disorder Mother    Family Psychiatric  History: See H&P  Social History:  History  Alcohol Use  . Yes    Comment: 3-4 drinks 2-4 x per month     History  Drug Use    Comment: Pt endorsed Marijuana use; UDS was clear    Social History   Social History  . Marital status: Single    Spouse name: N/A  . Number of children: N/A  . Years of education: N/A   Social History Main Topics  . Smoking status: Never Smoker  . Smokeless tobacco: Never Used  . Alcohol use Yes     Comment: 3-4 drinks 2-4 x per month  . Drug use: Yes     Comment: Pt endorsed Marijuana use; UDS was clear  . Sexual activity: Not Asked   Other Topics Concern  . None   Social History Narrative   Lives with mother sister and maternal aunt.   Hospital Course: Dresden Lozito is a 21 y/o M with history of treatment for  depression who presents on IVC with worsening symptoms of psychosis. Pt has recent onset of being withdrawn, nearly mute, amotivated, apathetic, paranoid, and with symptoms of depersonalization/derealization. As per ED report, pt had been experiencing increased latency, mutism, negativism, and staring spells. He was recently started on prozac on 10/21 and he had been continued on lamictal which was started last spring when pt was started on treatment for depression at [facility]. Pt has stressors of struggling with his coursework. He is not able to identify other stressors in his life. He acknowledges using alcohol and cannabis, which he thinks affected his sleep schedule. He describes using about 1 gram per day of cannabis and using more over the summer, which he notes caused him to stay up late and eat late. He reports drinking about 4 drinks on about 3-4 separate occassions per month. He denies other illicit substance use including synthetic cannabinoids, though his mother reports concern that he has been using synthetic cannabinoids.He reports that his current sleep pattern is normal. He denies anhedonia, guilty feelings, low energy, and poor concentration. He denies SI/HI/AH/VH. He endorses some paranoia that he felt others were out to get him while at the ED. He endorses some recent feelings of depersonalization feeling that he is not real and derealization feeling that the world around him is  not real. He endorses some apathy and amotivation, but otherwise he denies mood and anxiety symptoms. He denies symptoms of mania and denies any history of manic episode. He denies history of OCD or PTSD symptoms. Discussed with patient about treatment options and he is open to trial of abilify. He is not sure if lamictal has been helpful for him and he agrees to discontinue that medication. Pt had no further questions, comments, or concerns.  Burnis  was admitted to the hospital for worsening symptoms of depression  & crisis management due psychosis, mutism & paranoia. And because of his presentation of mutism, it was difficult to determine the reasons for his worsening depression with psychosis other than his struggles with his college course work. He reported on admission, history of Cannabis use disorder. He was in need of mood stabilization treatments.   After his admission assessment, Keynan's presenting symptoms were identified. The medication regimen targeting those symptoms were initiated. He was medicated & discharged on; Fluoxetine 20 mg for depression, Cogentin 0.5 mg for EPS, Risperdal 1 mg for mood control & Trazodone 50 mg for insomnia. He presented no other pre-existing medical problems that needed treatment. Nathanie was enrolled & participated in the group counseling sessions being offered & held on this unit. He learned coping skills..  During the course of his hospitalization, Devaun's improvement was monitored by observation & his daily report of symptom reduction noted. His emotional & mental status were monitored by daily self-inventory reports completed by him & the clinical staff. He was evaluated by the treatment team for stability and plans for continued recovery after discharge. He was offered further treatment options upon discharge on an outpatient basis as noted below. He was provided with all the necessary information needed to make this appointment without problems.   Upon discharge, Lyncoln was both mentally and medically stable. He denies suicidal/homicidal ideations, auditory/visual/tactile hallucinations, delusional thoughts or paranoia. He left [facility] with all belongings in no distress. Transportation per family.  Physical Findings: AIMS: Facial and Oral Movements Muscles of Facial Expression: None, normal Lips and Perioral Area: None, normal Jaw: None, normal Tongue: None, normal,Extremity Movements Upper (arms, wrists, hands, fingers): None, normal Lower (legs, knees, ankles, toes): None,  normal, Trunk Movements Neck, shoulders, hips: None, normal, Overall Severity Severity of abnormal movements (highest score from questions above): None, normal Incapacitation due to abnormal movements: None, normal Patient's awareness of abnormal movements (rate only patient's report): No Awareness, Dental Status Current problems with teeth and/or dentures?: No Does patient usually wear dentures?: No  CIWA:    COWS:     Musculoskeletal: Strength & Muscle Tone: within normal limits Gait & Station: normal Patient leans: N/A  Psychiatric Specialty Exam: Physical Exam  Constitutional: He appears well-developed.  HENT:  Head: Normocephalic.  Eyes: Pupils are equal, round, and reactive to light.  Neck: Normal range of motion.  Cardiovascular: Normal rate.   Respiratory: Effort normal.  GI: Soft.  Genitourinary:  Genitourinary Comments: Deferred  Musculoskeletal: Normal range of motion.  Neurological: He is alert.  Skin: Skin is warm.    ROS  Blood pressure 114/63, pulse 99, temperature 98 F (36.7 C), temperature source Oral, resp. rate 16, height 6\' 1"  (1.854 m), weight 98.9 kg (218 lb), SpO2 99 %.Body mass index is 28.76 kg/m.  See Md's SRA   Have you used any form of tobacco in the last 30 days? (Cigarettes, Smokeless Tobacco, Cigars, and/or Pipes): No  Has this patient used any form of tobacco in  the last 30 days? (Cigarettes, Smokeless Tobacco, Cigars, and/or Pipes): N/A  Blood Alcohol level:  Lab Results  Component Value Date   ETH <10 07/10/2017   Metabolic Disorder Labs:  No results found for: HGBA1C, MPG No results found for: PROLACTIN No results found for: CHOL, TRIG, HDL, CHOLHDL, VLDL, LDLCALC  See Psychiatric Specialty Exam and Suicide Risk Assessment completed by Attending Physician prior to discharge.  Discharge destination:  Home  Is patient on multiple antipsychotic therapies at discharge:  No   Has Patient had three or more failed trials of  antipsychotic monotherapy by history:  No  Recommended Plan for Multiple Antipsychotic Therapies: NA  Allergies as of 07/15/2017   No Known Allergies     Medication List    STOP taking these medications   docusate sodium 100 MG capsule Commonly known as:  COLACE   FLUoxetine 20 MG tablet Commonly known as:  PROZAC Replaced by:  FLUoxetine 20 MG capsule   lamoTRIgine 100 MG tablet Commonly known as:  LAMICTAL     TAKE these medications     Indication  benztropine 0.5 MG tablet Commonly known as:  COGENTIN Take 1 tablet (0.5 mg total) by mouth 2 (two) times daily as needed (EPS).  Indication:  Extrapyramidal Reaction caused by Medications   FLUoxetine 20 MG capsule Commonly known as:  PROZAC Take 1 capsule (20 mg total) by mouth daily. For depression Replaces:  FLUoxetine 20 MG tablet  Indication:  Major Depressive Disorder   hydrOXYzine 25 MG tablet Commonly known as:  ATARAX/VISTARIL Take 1 tablet (25 mg total) by mouth 3 (three) times daily as needed for anxiety.  Indication:  Feeling Anxious   polyethylene glycol powder powder Commonly known as:  GLYCOLAX/MIRALAX Take 17 g by mouth 2 (two) times daily. Until daily soft stools  OTC  Indication:  Constipation   risperiDONE 1 MG tablet Commonly known as:  RISPERDAL Take 1 tablet (1 mg total) by mouth at bedtime. For mood control  Indication:  Mood control   traZODone 50 MG tablet Commonly known as:  DESYREL Take 1 tablet (50 mg total) by mouth at bedtime as needed for sleep.  Indication:  Trouble Sleeping      Follow-up Information    [facility] Follow up on 07/22/2017.   Why:  Friday at 1:30PM with Mr Lerry LinerColucci Contact information: [id number]  [facility] [id number]   [phone]  F: [phone]         Follow-up recommendations: Activity:  As tolerated Diet: As recommended by your primary care doctor. Keep all scheduled follow-up  appointments as recommended.   Comments: Patient is instructed prior to discharge to: Take all medications as prescribed by his/her mental healthcare provider. Report any adverse effects and or reactions from the medicines to his/her outpatient provider promptly. Patient has been instructed & cautioned: To not engage in alcohol and or illegal drug use while on prescription medicines. In the event of worsening symptoms, patient is instructed to call the crisis hotline, 911 and or go to the nearest ED for appropriate evaluation and treatment of symptoms. To follow-up with his/her primary care provider for your other medical issues, concerns and or health care needs.   Signed: Sanjuana KavaNwoko, Agnes I, NP, PMHNP, FNP-BC 07/15/2017, 10:30 AM   Patient seen, Suicide Assessment Completed.  Disposition Plan Reviewed   Tacy DuraJohn Hogans is a 21 y/o M who was admitted with symptoms of psychosis  including withdrawal, decreased verbalization, and catatonic appearing presentation. Concern was raised for possible exposure to synthetic cannabinoids. Pt was started on regimen of abilify which he did not tolerate due to [place] and anxiety, and then he was switched to risperdal, which he has been tolerating without difficulty or side effect. Today, pt reports that he is doing well overall and he feels ready for discharge. He denies SI/HI/AH/VH. He is sleeping well and his appetite is good. He has been in contact with his mother, Jep Dyas, and she was contacted for collateral information. Pt's mother shares that pt sounds to be at baseline and she has no safety concerns with his discharge. Pt plans to follow up with mental health provider at [facility]. Pt was counseled to avoid illicit substances including cannabis and alcohol. Discussed with patient that his symptoms could be a result of use of synthetic cannabinoids or cannabis, and that he may have an underlying chronic, primary psychotic disorder which has the  potential to be unmasked by further use of cannabinoids; pt verbalized good understanding. Pt was able to engage in safety planning including returning to [facility] or contacting emergency services if he feels unable to maintain his own safety. Pt had no further questions, comments, or concerns.  Plan Of Care/Follow-up recommendations:  - Psychosis, unspecified r/o substance induced psychotic disorder vs primary psychotic disorder - Continue Risperdal 1mg  qhs - Continue prozac 20mg  qDay             - Continue hydroxyzine 25mg  q8h prn anxiety  Activity:  as tolerated Diet:  normal Tests:  NA Other:  see above for DC plan  Micheal Likens, MD

## 2017-07-15 NOTE — Progress Notes (Signed)
Patient discharged to lobby. Patient was stable and appreciative at that time. All papers and prescriptions were given and valuables returned. Verbal understanding expressed. Denies SI/HI and A/VH. Patient given opportunity to express concerns and ask questions.  

## 2017-07-15 NOTE — Progress Notes (Signed)
  [facility] :  Will you be returning to the same living situation after discharge:  Yes,  Home At discharge, do you have transportation home?: Yes,  Family  Do you have the ability to pay for your medications: Yes,  Insurance   Release of information consent forms completed and in the chart;  Patient's signature needed at discharge.  Patient to Follow up at: Follow-up Information    [facility] up on 07/22/2017.   Why:  Friday at 1:30PM with Mr Lerry LinerColucci Contact information: [id number]  [facility] [id number]   [phone]  F: [phone]          Next level of care provider has access to [facility] Link:no  Safety Planning and Suicide Prevention discussed: Yes,  Yes  Have you used any form of tobacco in the last 30 days? (Cigarettes, Smokeless Tobacco, Cigars, and/or Pipes): No  Has patient been referred to the Quitline?: N/A patient is not a smoker  Patient has been referred for addiction treatment: N/A  Kyle Mcmahon, Student-Social Work 07/15/2017, 2:57 PM

## 2017-07-15 NOTE — Progress Notes (Signed)
  [facility] :  Will you be returning to the same living situation after discharge:  Yes,  school At discharge, do you have transportation home?: Yes,  family Do you have the ability to pay for your medications: Yes,  insurance  Release of information consent forms completed and in the chart;  Patient's signature needed at discharge.  Patient to Follow up at: Follow-up Information    [facility] on 07/22/2017.   Why:  Friday at 1:30PM with Mr Lerry LinerColucci Contact information: [id number]  [facility] [id number]   [phone]  F: [phone]          Next level of care provider has access to [facility] Link:no  Safety Planning and Suicide Prevention discussed: Yes,  yes  Have you used any form of tobacco in the last 30 days? (Cigarettes, Smokeless Tobacco, Cigars, and/or Pipes): No  Has patient been referred to the Quitline?: N/A patient is not a smoker  Patient has been referred for addiction treatment: N/A  Ida RogueRodney B Jac Romulus, LCSW 07/15/2017, 11:28 AM

## 2017-09-13 DIAGNOSIS — F319 Bipolar disorder, unspecified: Secondary | ICD-10-CM

## 2017-09-13 HISTORY — DX: Bipolar disorder, unspecified: F31.9

## 2017-11-16 ENCOUNTER — Encounter (HOSPITAL_COMMUNITY): Payer: Self-pay | Admitting: Nurse Practitioner

## 2017-11-16 ENCOUNTER — Emergency Department (HOSPITAL_COMMUNITY)
Admission: EM | Admit: 2017-11-16 | Discharge: 2017-11-17 | Disposition: A | Discharge status: Other Acute Inpatient Hospital | Payer: BC Managed Care – PPO | Attending: Emergency Medicine | Admitting: Emergency Medicine

## 2017-11-16 ENCOUNTER — Other Ambulatory Visit: Payer: Self-pay

## 2017-11-16 DIAGNOSIS — F19959 Other psychoactive substance use, unspecified with psychoactive substance-induced psychotic disorder, unspecified: Secondary | ICD-10-CM

## 2017-11-16 DIAGNOSIS — F309 Manic episode, unspecified: Secondary | ICD-10-CM | POA: Diagnosis not present

## 2017-11-16 DIAGNOSIS — F1290 Cannabis use, unspecified, uncomplicated: Secondary | ICD-10-CM | POA: Diagnosis not present

## 2017-11-16 DIAGNOSIS — Z79899 Other long term (current) drug therapy: Secondary | ICD-10-CM | POA: Insufficient documentation

## 2017-11-16 DIAGNOSIS — R4587 Impulsiveness: Secondary | ICD-10-CM | POA: Diagnosis not present

## 2017-11-16 DIAGNOSIS — Z818 Family history of other mental and behavioral disorders: Secondary | ICD-10-CM | POA: Diagnosis not present

## 2017-11-16 LAB — RAPID URINE DRUG SCREEN, HOSP PERFORMED
AMPHETAMINES: NOT DETECTED
BARBITURATES: NOT DETECTED
BENZODIAZEPINES: NOT DETECTED
COCAINE: NOT DETECTED
Opiates: NOT DETECTED
Tetrahydrocannabinol: POSITIVE — AB

## 2017-11-16 LAB — COMPREHENSIVE METABOLIC PANEL
ALBUMIN: 4.7 g/dL (ref 3.5–5.0)
ALT: 22 U/L (ref 17–63)
ANION GAP: 12 (ref 5–15)
AST: 27 U/L (ref 15–41)
Alkaline Phosphatase: 57 U/L (ref 38–126)
BILIRUBIN TOTAL: 0.8 mg/dL (ref 0.3–1.2)
BUN: 16 mg/dL (ref 6–20)
CO2: 25 mmol/L (ref 22–32)
Calcium: 9.7 mg/dL (ref 8.9–10.3)
Chloride: 104 mmol/L (ref 101–111)
Creatinine, Ser: 1.04 mg/dL (ref 0.61–1.24)
GFR calc Af Amer: >60 mL/min (ref >60)
GFR calc non Af Amer: >60 mL/min (ref >60)
GLUCOSE: 90 mg/dL (ref 65–99)
Potassium: 3.7 mmol/L (ref 3.5–5.1)
SODIUM: 141 mmol/L (ref 135–145)
TOTAL PROTEIN: 8.6 g/dL — AB (ref 6.5–8.1)

## 2017-11-16 LAB — URINALYSIS, ROUTINE W REFLEX MICROSCOPIC
BACTERIA UA: NONE SEEN
Bilirubin Urine: NEGATIVE
Glucose, UA: NEGATIVE mg/dL
Hgb urine dipstick: NEGATIVE
Ketones, ur: 5 mg/dL — AB
Leukocytes, UA: NEGATIVE
Nitrite: NEGATIVE
Protein, ur: 30 mg/dL — AB
SPECIFIC GRAVITY, URINE: 1.03 (ref 1.005–1.030)
SQUAMOUS EPITHELIAL / LPF: NONE SEEN
pH: 5 (ref 5.0–8.0)

## 2017-11-16 LAB — CBC WITH DIFFERENTIAL/PLATELET
BASOS ABS: 0 10*3/uL (ref 0.0–0.1)
Basophils Relative: 0 %
EOS PCT: 0 %
Eosinophils Absolute: 0 10*3/uL (ref 0.0–0.7)
HEMATOCRIT: 41.6 % (ref 39.0–52.0)
Hemoglobin: 14.1 g/dL (ref 13.0–17.0)
Lymphocytes Relative: 27 %
Lymphs Abs: 3 10*3/uL (ref 0.7–4.0)
MCH: 27.9 pg (ref 26.0–34.0)
MCHC: 33.9 g/dL (ref 30.0–36.0)
MCV: 82.2 fL (ref 78.0–100.0)
MONO ABS: 1.3 10*3/uL — AB (ref 0.1–1.0)
MONOS PCT: 12 %
NEUTROS ABS: 6.8 10*3/uL (ref 1.7–7.7)
Neutrophils Relative %: 61 %
Platelets: 335 10*3/uL (ref 150–400)
RBC: 5.06 MIL/uL (ref 4.22–5.81)
RDW: 13.5 % (ref 11.5–15.5)
WBC: 11.1 10*3/uL — ABNORMAL HIGH (ref 4.0–10.5)

## 2017-11-16 LAB — ETHANOL: Alcohol, Ethyl (B): <10 mg/dL (ref <10)

## 2017-11-16 LAB — SALICYLATE LEVEL

## 2017-11-16 LAB — ACETAMINOPHEN LEVEL

## 2017-11-16 MED ORDER — LORAZEPAM 1 MG PO TABS
2.0000 mg | ORAL_TABLET | Freq: Once | ORAL | Status: AC
  Administered 2017-11-16: 2 mg via ORAL
  Filled 2017-11-16: qty 2

## 2017-11-16 MED ORDER — LORAZEPAM 2 MG/ML IJ SOLN
2.0000 mg | Freq: Once | INTRAMUSCULAR | Status: AC
  Administered 2017-11-16: 2 mg via INTRAMUSCULAR
  Filled 2017-11-16: qty 1

## 2017-11-16 NOTE — ED Provider Notes (Signed)
[facility]-EMERGENC[facility] DEPT Provider Note   CSN: [id number] Arrival date & time: 11/16/17  1529     History   Chief Complaint No chief complaint on file.   HPI Kyle Mcmahon is a 22 y.o. male who presents to the emergency department by EMS with a chief complaint of elevated mood.  He states that he feels like Apollo. He has been hearing special messages. His mother also reports that she feels the patient is fixated on her and on her possibly dying.  The patient reports that he feels amazing his mother is concerned because he has not taken any of his regular medications over the last week since his symptoms began.  She reports earlier today that he stated that if something happened to her that he would kill himself.  The patient reports that he has not slept in the last 7 days.  His mother reports that he has also written over 50 essays during that time.  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  The patient's mother reports that 4 days ago that she was more concerned about his behavior so the patient's psychiatrist started him on Risperdal with no change in the patient's behavior.  She reports that his symptoms persisted and yesterday he was started on trazodone and has taken 1 dose.  The patient states "I think I smoked some bad weed." His mother also reports that the patient handed over his entire stash of strawberry vodka to her.   Upon arrival to the ED, the patient was noted to be singing loudly and skipping through the hallway, but suddenly turned and became extremely agitated and became difficult to redirect despite security being present.   The patient's mother states that she has Bipolar Disorder.   Level V caveat secondary to psychiatric disorder.   The history is provided by the patient. No language interpreter was used.    Past Medical History:  Diagnosis Date  . Depression     Patient Active Problem List   Diagnosis Date Noted  .  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 Behavior change due to substance use 07/11/2017  . Abscess 02/17/2017  . Preventative health care 08/05/2014    Past Surgical History:  Procedure Laterality Date  . HERNIA REPAIR     baby  . 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 Authorizing Provider  FLUoxetine (PROZAC) 20 MG capsule Take 1 capsule (20 mg total) by mouth daily. For depression 07/16/17  Yes Armandina StammerNwoko, Agnes I, NP  risperiDONE (RISPERDAL) 1 MG tablet Take 1 tablet (1 mg total) by mouth at bedtime. For mood control 07/15/17  Yes Armandina StammerNwoko, Agnes I, NP  traZODone (DESYREL) 50 MG tablet Take 1 tablet (50 mg total) by mouth at bedtime as needed for sleep. 07/15/17  Yes Armandina StammerNwoko, Agnes I, NP  Adapalene 0.3 % gel  08/12/17   [provider]  benztropine (COGENTIN) 0.5 MG tablet Take 1 tablet (0.5 mg total) by mouth 2 (two) times daily as needed (EPS). 07/15/17   Armandina StammerNwoko, Agnes I, NP  hydrOXYzine (ATARAX/VISTARIL) 25 MG tablet Take 1 tablet (25 mg total) by mouth 3 (three) times daily as needed for anxiety. 07/15/17   Armandina StammerNwoko, Agnes I, NP  polyethylene glycol powder (GLYCOLAX/MIRALAX) powder Take 17 g by mouth 2 (two) times daily. Until daily  soft stools  OTC Patient taking differently: Take 17 g by mouth 2 (two) times daily as needed for moderate constipation. Until daily soft stools  OTC 07/15/17   Sanjuana Kava, NP    Family History Family History  Problem Relation Age of Onset  . Bipolar disorder Mother     Social History Social History   Tobacco Use  . Smoking status: Never Smoker  . Smokeless tobacco: Never Used  Substance Use Topics  . Alcohol use: Yes    Comment: 3-4 drinks 2-4 x per month  . Drug use: Yes    Comment: Pt endorsed Marijuana use; UDS was clear     Allergies   Patient has no known allergies.   Review of Systems Review of  Systems  Unable to perform ROS: Psychiatric disorder  Psychiatric/Behavioral: Positive for agitation, dysphoric mood and suicidal ideas.   Physical Exam Updated Vital Signs There were no vitals taken for this visit.  Physical Exam  Constitutional: He appears well-developed.  HENT:  Head: Normocephalic.  Eyes: Conjunctivae are normal.  Neck: Neck supple.  Cardiovascular: Normal rate and regular rhythm.  No murmur heard. Pulmonary/Chest: Effort normal.  Abdominal: Soft. He exhibits no distension.  Neurological: He is alert.  Skin: Skin is warm and dry.  Psychiatric: His affect is angry, labile and inappropriate. His speech is rapid and/or pressured and tangential. He is agitated, aggressive, hyperactive and combative. He is not actively hallucinating. Thought content is not paranoid and not delusional. He expresses impulsivity and inappropriate judgment. He is inattentive.  Nursing note and vitals reviewed.    ED Treatments / Results  Labs (all labs ordered are listed, but only abnormal results are displayed) Labs Reviewed  COMPREHENSIVE METABOLIC PANEL - Abnormal; Notable for the following components:      Result Value   Total Protein 8.6 (*)    All other components within normal limits  RAPID URINE DRUG SCREEN, HOSP PERFORMED - Abnormal; Notable for the following components:   Tetrahydrocannabinol POSITIVE (*)    All other components within normal limits  CBC WITH DIFFERENTIAL/PLATELET - Abnormal; Notable for the following components:   WBC 11.1 (*)    Monocytes Absolute 1.3 (*)    All other components within normal limits  URINALYSIS, ROUTINE W REFLEX MICROSCOPIC - Abnormal; Notable for the following components:   Ketones, ur 5 (*)    Protein, ur 30 (*)    All other components within normal limits  ACETAMINOPHEN LEVEL - Abnormal; Notable for the following components:   Acetaminophen (Tylenol), Serum <10 (*)    All other components within normal limits  ETHANOL    SALICYLATE LEVEL    EKG  EKG Interpretation None       Radiology No results found.  Procedures Procedures (including critical care time)  Medications Ordered in ED Medications  LORazepam (ATIVAN) tablet 2 mg (2 mg Oral Given 11/16/17 1718)  LORazepam (ATIVAN) injection 2 mg (2 mg Intramuscular Given 11/16/17 1959)     Initial Impression / Assessment and Plan / ED Course  I have reviewed the triage vital signs and the nursing notes.  Pertinent labs & imaging results that were available during my care of the patient were reviewed by me and considered in my medical decision making (see chart for details).     22 year old male presenting with manic episode.  Patient was discussed and evaluated with Dr. Rush Landmark, attending physician.  IVC paperwork has been filed by Dr. Rush Landmark.  UDS positive for THC.  Labs are otherwise reassuring.  On initial evaluation, the patient was able to be redirected after he was offered a sandwich and was compliant with taking 2 p.o. Ativan along with a Sprite. Pt medically cleared at this time. Psych hold orders and home med orders placed. TTS consult pending; please see psych team notes for further documentation of care/dispo. Pt stable at time of med clearance.     Final Clinical Impressions(s) / ED Diagnoses   Final diagnoses:  Manic episode [facility])    ED Discharge Orders    None       Barkley Boards, PA-C 11/16/17 2055    Tegeler, Canary Brim, MD 11/16/17 2351

## 2017-11-16 NOTE — ED Triage Notes (Signed)
Mother is talking for patient. She states she thinks patient" smoked some bad weed". Patient has been singing and hard to redirect.
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
## 2017-11-16 NOTE — ED Notes (Addendum)
Pt presents with mania, has not slept in a week and has written over 50 essays in the past week.  Pt also fixated on his mother and concerned she is going to die.  Mother does not feel safe around him.  Pt exhibiting violent outbursts at home to singing loudly.  A&O x 3, no distress noted, cooperative and anxious at present.  Monitoring for safety, Q 15 min checks in effect.  Pt is IVC.

## 2017-11-16 NOTE — ED Notes (Signed)
Pt being assessed by TTS Marcus at present. 

## 2017-11-16 NOTE — ED Notes (Signed)
Report given to [facility] RN 

## 2017-11-17 ENCOUNTER — Other Ambulatory Visit: Payer: Self-pay

## 2017-11-17 ENCOUNTER — Encounter (HOSPITAL_COMMUNITY): Payer: Self-pay

## 2017-11-17 ENCOUNTER — Inpatient Hospital Stay (HOSPITAL_COMMUNITY)
Admission: AD | Admit: 2017-11-17 | Discharge: 2017-11-22 | DRG: 885 | Disposition: A | Discharge status: Home / Self Care | Payer: BC Managed Care – PPO | Attending: Psychiatry | Admitting: Psychiatry

## 2017-11-17 DIAGNOSIS — Z818 Family history of other mental and behavioral disorders: Secondary | ICD-10-CM

## 2017-11-17 DIAGNOSIS — F19959 Other psychoactive substance use, unspecified with psychoactive substance-induced psychotic disorder, unspecified: Secondary | ICD-10-CM

## 2017-11-17 DIAGNOSIS — F419 Anxiety disorder, unspecified: Secondary | ICD-10-CM | POA: Diagnosis present

## 2017-11-17 DIAGNOSIS — F3110 Bipolar disorder, current episode manic without psychotic features, unspecified: Principal | ICD-10-CM | POA: Diagnosis present

## 2017-11-17 DIAGNOSIS — F1290 Cannabis use, unspecified, uncomplicated: Secondary | ICD-10-CM | POA: Diagnosis not present

## 2017-11-17 DIAGNOSIS — F319 Bipolar disorder, unspecified: Secondary | ICD-10-CM | POA: Diagnosis present

## 2017-11-17 DIAGNOSIS — R450 Nervousness: Secondary | ICD-10-CM | POA: Diagnosis not present

## 2017-11-17 DIAGNOSIS — Z79899 Other long term (current) drug therapy: Secondary | ICD-10-CM | POA: Diagnosis not present

## 2017-11-17 DIAGNOSIS — F12159 Cannabis abuse with psychotic disorder, unspecified: Secondary | ICD-10-CM | POA: Diagnosis present

## 2017-11-17 DIAGNOSIS — G4700 Insomnia, unspecified: Secondary | ICD-10-CM | POA: Diagnosis present

## 2017-11-17 DIAGNOSIS — F309 Manic episode, unspecified: Secondary | ICD-10-CM | POA: Diagnosis not present

## 2017-11-17 DIAGNOSIS — R4587 Impulsiveness: Secondary | ICD-10-CM | POA: Diagnosis not present

## 2017-11-17 MED ORDER — ACETAMINOPHEN 325 MG PO TABS: 650.0000 mg | ORAL_TABLET | Freq: Four times a day (QID) | ORAL | Status: DC | PRN

## 2017-11-17 MED ORDER — HYDROXYZINE HCL 25 MG PO TABS
25.0000 mg | ORAL_TABLET | Freq: Three times a day (TID) | ORAL | Status: DC | PRN
  Administered 2017-11-18: 25 mg via ORAL
  Filled 2017-11-17: qty 1

## 2017-11-17 MED ORDER — OLANZAPINE 10 MG PO TBDP
10.0000 mg | ORAL_TABLET | ORAL | Status: AC
  Administered 2017-11-17: 10 mg via ORAL
  Filled 2017-11-17: qty 1

## 2017-11-17 MED ORDER — RISPERIDONE 1 MG PO TABS
1.0000 mg | ORAL_TABLET | Freq: Every day | ORAL | Status: DC
  Filled 2017-11-17 (×2): qty 1

## 2017-11-17 MED ORDER — TRAZODONE HCL 50 MG PO TABS: 50.0000 mg | ORAL_TABLET | Freq: Every evening | ORAL | Status: DC | PRN

## 2017-11-17 MED ORDER — ALUM & MAG HYDROXIDE-SIMETH 200-200-20 MG/5ML PO SUSP: 30.0000 mL | ORAL | Status: DC | PRN

## 2017-11-17 MED ORDER — MAGNESIUM HYDROXIDE 400 MG/5ML PO SUSP
30.0000 mL | Freq: Every day | ORAL | Status: DC | PRN
  Administered 2017-11-19: 30 mL via ORAL
  Filled 2017-11-17 (×2): qty 30

## 2017-11-17 NOTE — ED Notes (Addendum)
Patient requested writer to come into his room so that he could explain his "new discovery."  Upon entry writer discovered that patient had drawn a family tree with connections to greek mythology on his wall in crayon.  Writer retrieved crayon from patient.  Patient then observed on camera writing on wall again.  Writer went to retrieve additional crayon and instead found patient drawing on wall with a cheese stick.  When questioned about actions patient stated, "You said I couldn't use a crayon.  I didn't think it would be a problem to use a cheese stick."    Patient is manic but cooperative.

## 2017-11-17 NOTE — Tx Team (Signed)
Initial Treatment Plan 11/17/2017 4:14 PM Kyle MinorJohn C Montel RUE:[id number]    PATIENT STRESSORS: Educational concerns Substance abuse   PATIENT STRENGTHS: MetallurgistCommunication skills Financial means General fund of knowledge Physical Health   PATIENT IDENTIFIED PROBLEMS: Substance abuse  Psychosis/delusional/bizarre  "Change the world"                 DISCHARGE CRITERIA:  Improved stabilization in mood, thinking, and/or behavior Verbal commitment to aftercare and medication compliance  PRELIMINARY DISCHARGE PLAN: Outpatient therapy Medication management  PATIENT/FAMILY INVOLVEMENT: This treatment plan has been presented to and reviewed with the patient, Kyle MinorJohn C Mcmahon.  The patient and family have been given the opportunity to ask questions and make suggestions.  Levin BaconHeather V Pharrell Ledford, RN 11/17/2017, 4:14 PM

## 2017-11-17 NOTE — ED Notes (Addendum)
Patient smeared the collard greens from his lunch tray on the wall.

## 2017-11-17 NOTE — Progress Notes (Signed)
Adult Psychoeducational Group Note  Date:  11/17/2017 Time:  1600  Group Topic/Focus:  Coping With Mental Health Crisis:   The purpose of this group is to help patients identify strategies for coping with mental health crisis.  Group discusses possible causes of crisis and ways to manage them effectively.  Participation Level:  Did not attend.

## 2017-11-17 NOTE — ED Notes (Signed)
Pt IVC.  E signature not completed.

## 2017-11-17 NOTE — ED Notes (Signed)
Patient's aunt at bedside.

## 2017-11-17 NOTE — Discharge Instructions (Signed)
For your behavioral health needs, you are advised to continue treatment with your current outpatient provider. °
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
## 2017-11-17 NOTE — ED Notes (Signed)
Patient's mother at bedside.

## 2017-11-17 NOTE — BH Assessment (Addendum)
[facility]  Per Juanetta BeetsJacqueline Norman, DO, this pt does not require psychiatric hospitalization at this time.  Pt presents under IVC initiated by EDP Lynden Oxfordhristopher Tegeler, MD, which Dr Sharma CovertNorman has rescinded.  [facility] recommendation to continue treatment with his current provider.  This has been included in pt's discharge instructions.  Pt's nurse, Gerri SporeKneasha, has been notified.  Kyle Canninghomas Felipe Cabell, MA Triage Specialist [phone]   Addendum:  After further examination, Dr Sharma CovertNorman finds that pt requires psychiatric hospitalization.  IVC by Dr Rush Landmarkegeler remains intact.  Berneice Heinrichina Tate, RN, [facility]  [facility].  Pt's nurse, Everardo PacificKenisha, has been notified, and agrees to call report to [phone].  Pt is to be transported via [employer].   Kyle Mcmahon, [place] Behavioral Health Coordinator [phone]

## 2017-11-17 NOTE — Progress Notes (Signed)
Kyle RuizJohn is a 22 year old male being admitted involuntarily to 300-2 from [facility].  He came in to the ED via EMS for delusions, substance abuse and hearing special messages.  He was manic and not taking his psychiatric medications.  [facility], he was hyper-verbal, irritable, delusional and difficult to redirect.  He denies any SI/HI.  He denies any medical issues at this time.  Oriented him to the unit.  Admission paperwork completed and signed.  Belongings searched and secured in locker # 28, no contraband found.  Skin assessment completed and no skin issues noted.  Q 15 minute checks initiated for safety.  We will continue to monitor the progress towards his goals.

## 2017-11-17 NOTE — ED Notes (Signed)
[facility] [employer].  Patient handcuffed.  No distressed noted. All belongings and [facility].

## 2017-11-17 NOTE — Progress Notes (Signed)
Adult Psychoeducational Group Note  Date:  11/17/2017 Time:  8:43 PM  Group Topic/Focus:  Wrap-Up Group:   The focus of this group is to help patients review their daily goal of treatment and discuss progress on daily workbooks.  Participation Level:  Did Not Attend  Participation Quality:  Did not attend  Affect:  Did not attend  Cognitive:  Did not attend  Insight: None  Engagement in Group:  Did not attend  Modes of Intervention:  Did not attend  Additional Comments:  Patient did not attend wrap up group tonight.  Avila Albritton L Alexus Michael 11/17/2017, 8:43 PM

## 2017-11-18 DIAGNOSIS — F319 Bipolar disorder, unspecified: Secondary | ICD-10-CM | POA: Diagnosis present

## 2017-11-18 DIAGNOSIS — F3110 Bipolar disorder, current episode manic without psychotic features, unspecified: Principal | ICD-10-CM | POA: Diagnosis present

## 2017-11-18 DIAGNOSIS — Z818 Family history of other mental and behavioral disorders: Secondary | ICD-10-CM

## 2017-11-18 DIAGNOSIS — F19959 Other psychoactive substance use, unspecified with psychoactive substance-induced psychotic disorder, unspecified: Secondary | ICD-10-CM

## 2017-11-18 MED ORDER — TRAZODONE HCL 100 MG PO TABS: 100.0000 mg | ORAL_TABLET | Freq: Every evening | ORAL | Status: DC | PRN

## 2017-11-18 MED ORDER — LORAZEPAM 1 MG PO TABS
1.0000 mg | ORAL_TABLET | Freq: Three times a day (TID) | ORAL | Status: DC | PRN
  Administered 2017-11-18 – 2017-11-19 (×2): 1 mg via ORAL
  Filled 2017-11-18 (×2): qty 1

## 2017-11-18 MED ORDER — TRAZODONE HCL 50 MG PO TABS
50.0000 mg | ORAL_TABLET | Freq: Every evening | ORAL | Status: DC | PRN
  Administered 2017-11-18 – 2017-11-19 (×4): 50 mg via ORAL
  Filled 2017-11-18 (×8): qty 1

## 2017-11-18 MED ORDER — TRAZODONE HCL 50 MG PO TABS: 50.0000 mg | ORAL_TABLET | Freq: Every evening | ORAL | Status: DC | PRN

## 2017-11-18 MED ORDER — PALIPERIDONE ER 6 MG PO TB24
6.0000 mg | ORAL_TABLET | Freq: Every day | ORAL | Status: DC
  Administered 2017-11-18 – 2017-11-19 (×2): 6 mg via ORAL
  Filled 2017-11-18 (×3): qty 1

## 2017-11-18 NOTE — Plan of Care (Signed)
Patient has had no injuries this shift. Patient has taken medications as prescribed.

## 2017-11-18 NOTE — Progress Notes (Signed)
Adult Psychoeducational Group Note  Date:  11/18/2017 Time:  9:33 PM  Group Topic/Focus:  Wrap-Up Group:   The focus of this group is to help patients review their daily goal of treatment and discuss progress on daily workbooks.  Participation Level:  Active  Participation Quality:  Appropriate  Affect:  Excited  Cognitive:  Oriented  Insight: Limited  Engagement in Group:  Engaged  Modes of Intervention:  Socialization and Support  Additional Comments:  Patient attended and participating in group tonight. He reports that today was a 11. He meet a lot of people, he played basketball today. He had visitors today, and the visit was amazing.  Lita MainsFrancis, Yessika Otte [facility] 11/18/2017, 9:33 PM

## 2017-11-18 NOTE — H&P (Addendum)
Psychiatric Admission Assessment Adult  Patient Identification: Kyle Mcmahon MRN:  [id number] Date of Evaluation:  11/18/2017 Chief Complaint:  MDD SUBSTANCE INDUCED PSYCHOTIC DISORDER Principal Diagnosis: Bipolar I disorder, most recent episode (or current) manic ([place]) Diagnosis:   Patient Active Problem List   Diagnosis Date Noted  . Bipolar I disorder, most recent episode (or current) manic ([place]) [F31.10] 11/18/2017  . Substance-induced psychotic disorder ([place]) [F19.959] 07/12/2017  . Behavior change due to substance use [R46.89] 07/11/2017  . Abscess [L02.91] 02/17/2017  . Preventative health care [Z00.00] 08/05/2014   History of Present Illness:  11/17/17 Psychiatric Consult Note: 22 y.o. male patient admitted to the ED for recent increase in elevated mood and bizzare behaviors at home.Patient brought in to the ED by his mother who had significant concern for his acute change in presentation.Patient states that he is Kyle Mcmahon and states he has been hearing special messages. His mother also reported that she felt that the patient was fixated on her at home on the possibility of her dying.This morning the patient endorses sleeping well last night, but has been up multiple days without sleep previously. Patient feels that the main exacerbating issue for how he feels right now may have been "bad weed". On admission patient's mother stated that patient had been drinking strawberry vodka, but patient denies this morning. Patient states that he has previously been receiving psychiatric assistance at [facility] as a [employer]. Patient denies suicidal ideation, thoughts of hurting himself or others. Patient does endorse feeling like all of the commercials on TV are directly talking to him and putting songs in his head.   On Evaluation: Patient confirms all of the above information and he continued with hyperverbal conversation about [place] mythology and his mother and his aunt being part  of the [place]. He ruminating, easily distracted, pressured speech, not easy to redirect. Restless and wanted to stand during interview and show how he was writing on the walls at the ED. He reports that he does not take his medications as he should, but the medications usually work well for him and he is interested in the [place], since Abilify he did not tolerate well. He denies any SI/HI/AVH and contracts for safety.   Associated Signs/Symptoms: Depression Symptoms:  denies (Hypo) Manic Symptoms:  Delusions, Distractibility, Elevated Mood, Flight of Ideas, Grandiosity, Impulsivity, Labiality of Mood, Anxiety Symptoms:  Excessive Worry, Psychotic Symptoms:  Delusions, Ideas of Reference, Paranoia, PTSD Symptoms: NA Total Time spent with patient: 45 minutes  Past Psychiatric History: One previous hospitalization, Bipolar I,   Is the patient at risk to self? Yes.    Has the patient been a risk to self in the past 6 months? Yes.    Has the patient been a risk to self within the distant past? Yes.    Is the patient a risk to others? No.  Has the patient been a risk to others in the past 6 months? No.  Has the patient been a risk to others within the distant past? No.   Prior Inpatient Therapy:   Prior Outpatient Therapy:    Alcohol Screening: 1. How often do you have a drink containing alcohol?: Never 2. How many drinks containing alcohol do you have on a typical day when you are drinking?: 1 or 2 3. How often do you have six or more drinks on one occasion?: Never AUDIT-C Score: 0 9. Have you or someone else been injured as a result of your  drinking?: No 10. Has a relative or friend or a doctor or another health worker been concerned about your drinking or suggested you cut down?: No Alcohol Use Disorder Identification Test Final Score (AUDIT): 0 Intervention/Follow-up: AUDIT Score <7 follow-up not indicated Substance Abuse History in the last 12 months:  Yes.    Consequences of Substance Abuse: Medical Consequences:  reviewed Legal Consequences:  reviewed Family Consequences:  reviewed Previous Psychotropic Medications: Yes  Psychological Evaluations: Yes  Past Medical History:  Past Medical History:  Diagnosis Date  . Depression     Past Surgical History:  Procedure Laterality Date  . HERNIA REPAIR     baby  . 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 Bipolar disorder Mother    Family Psychiatric  History: Unknown Tobacco Screening: Have you used any form of tobacco in the last 30 days? (Cigarettes, Smokeless Tobacco, Cigars, and/or Pipes): No Social History:  Social History   Substance and Sexual Activity  Alcohol Use Yes   Comment: 3-4 drinks 2-4 x per month     Social History   Substance and Sexual Activity  Drug Use Yes   Comment: Pt endorsed Marijuana use; UDS was clear    Additional Social History:        Allergies:  No Known Allergies Lab Results:  Results for orders placed or performed during the hospital encounter of 11/16/17 (from the past 48 hour(s))  Comprehensive metabolic panel     Status: Abnormal   Collection Time: 11/16/17  6:10 PM  Result Value Ref Range   Sodium 141 135 - 145 mmol/L   Potassium 3.7 3.5 - 5.1 mmol/L   Chloride 104 101 - 111 mmol/L   CO2 25 22 - 32 mmol/L   Glucose, Bld 90 65 - 99 mg/dL   BUN 16 6 - 20 mg/dL   Creatinine, Ser 1.04 0.61 - 1.24 mg/dL   Calcium 9.7 8.9 - 10.3 mg/dL   Total Protein 8.6 (H) 6.5 - 8.1 g/dL   Albumin 4.7 3.5 - 5.0 g/dL   AST 27 15 - 41 U/L   ALT 22 17 - 63 U/L   Alkaline Phosphatase 57 38 - 126 U/L   Total Bilirubin 0.8 0.3 - 1.2 mg/dL   GFR calc non Af Amer >60 >60 mL/min   GFR calc Af Amer >60 >60 mL/min    Comment: (NOTE) The eGFR has been calculated using the CKD EPI equation. This calculation has not been validated in all clinical situations. eGFR's persistently <60  mL/min signify possible Chronic Kidney Disease.    Anion gap 12 5 - 15    Comment: Performed at [facility], [place] [address], [place], [place] [address]  Ethanol     Status: None   Collection Time: 11/16/17  6:10 PM  Result Value Ref Range   Alcohol, Ethyl (B) <10 <10 mg/dL    Comment:        LOWEST DETECTABLE LIMIT FOR SERUM ALCOHOL IS 10 mg/dL FOR MEDICAL PURPOSES ONLY Performed at [place] [address], [place], [place] [address]   CBC with Diff     Status: Abnormal   Collection Time: 11/16/17  6:10 PM  Result Value Ref Range   WBC 11.1 (H) 4.0 - 10.5 K/uL   RBC 5.06 4.22 - 5.81 MIL/uL   Hemoglobin 14.1 13.0 - 17.0 g/dL   HCT  41.6 39.0 - 52.0 %   MCV 82.2 78.0 - 100.0 fL   MCH 27.9 26.0 - 34.0 pg   MCHC 33.9 30.0 - 36.0 g/dL   RDW 13.5 11.5 - 15.5 %   Platelets 335 150 - 400 K/uL   Neutrophils Relative % 61 %   Lymphocytes Relative 27 %   Monocytes Relative 12 %   Eosinophils Relative 0 %   Basophils Relative 0 %   Neutro Abs 6.8 1.7 - 7.7 K/uL   Lymphs Abs 3.0 0.7 - 4.0 K/uL   Monocytes Absolute 1.3 (H) 0.1 - 1.0 K/uL   Eosinophils Absolute 0.0 0.0 - 0.7 K/uL   Basophils Absolute 0.0 0.0 - 0.1 K/uL   Smear Review MORPHOLOGY UNREMARKABLE     Comment: Performed at [facility], [place] [address], [place], [place] [address]  Salicylate level     Status: None   Collection Time: 11/16/17  6:10 PM  Result Value Ref Range   Salicylate Lvl <0.1 2.8 - 30.0 mg/dL    Comment: Performed at [facility], [place] [address], [place], [place] [address]  Acetaminophen level     Status: Abnormal   Collection Time: 11/16/17  6:10 PM  Result Value Ref Range   Acetaminophen (Tylenol), Serum <10 (L) 10 - 30 ug/mL    Comment:        THERAPEUTIC CONCENTRATIONS VARY SIGNIFICANTLY. A RANGE OF 10-30 ug/mL MAY BE AN EFFECTIVE CONCENTRATION FOR MANY PATIENTS. HOWEVER, SOME ARE BEST TREATED AT CONCENTRATIONS  OUTSIDE THIS RANGE. ACETAMINOPHEN CONCENTRATIONS >150 ug/mL AT 4 HOURS AFTER INGESTION AND >50 ug/mL AT 12 HOURS AFTER INGESTION ARE OFTEN ASSOCIATED WITH TOXIC REACTIONS. Performed at [facility], [place] [address], [place], [place] [address]   Urine rapid drug screen (hosp performed)     Status: Abnormal   Collection Time: 11/16/17  6:14 PM  Result Value Ref Range   Opiates NONE DETECTED NONE DETECTED   Cocaine NONE DETECTED NONE DETECTED   Benzodiazepines NONE DETECTED NONE DETECTED   Amphetamines NONE DETECTED NONE DETECTED   Tetrahydrocannabinol POSITIVE (A) NONE DETECTED   Barbiturates NONE DETECTED NONE DETECTED    Comment: (NOTE) DRUG SCREEN FOR MEDICAL PURPOSES ONLY.  IF CONFIRMATION IS NEEDED FOR ANY PURPOSE, NOTIFY LAB WITHIN 5 DAYS. LOWEST DETECTABLE LIMITS FOR URINE DRUG SCREEN Drug Class                     Cutoff (ng/mL) Amphetamine and metabolites    1000 Barbiturate and metabolites    200 Benzodiazepine                 233 Tricyclics and metabolites     300 Opiates and metabolites        300 Cocaine and metabolites        300 THC                            50 Performed at [facility], [place] [address], [place], [place] [address]   Urinalysis, Routine w reflex microscopic     Status: Abnormal   Collection Time: 11/16/17  6:14 PM  Result Value Ref Range   Color, Urine YELLOW YELLOW   APPearance CLEAR CLEAR   Specific Gravity, Urine 1.030 1.005 - 1.030   pH 5.0 5.0 - 8.0   Glucose, UA NEGATIVE NEGATIVE mg/dL   Hgb urine dipstick NEGATIVE NEGATIVE   Bilirubin  Urine NEGATIVE NEGATIVE   Ketones, ur 5 (A) NEGATIVE mg/dL   Protein, ur 30 (A) NEGATIVE mg/dL   Nitrite NEGATIVE NEGATIVE   Leukocytes, UA NEGATIVE NEGATIVE   RBC / HPF 0-5 0 - 5 RBC/hpf   WBC, UA 0-5 0 - 5 WBC/hpf   Bacteria, UA NONE SEEN NONE SEEN   Squamous Epithelial / LPF NONE SEEN NONE SEEN   Mucus PRESENT     Comment: Performed at [facility], [place] [address] [place], [place] [address]    Blood Alcohol level:  Lab Results  Component Value Date   ETH <10 11/16/2017   ETH <10 41/66/0630    Metabolic Disorder Labs:  No results found for: HGBA1C, MPG No results found for: PROLACTIN No results found for: CHOL, TRIG, HDL, CHOLHDL, VLDL, LDLCALC  Current Medications: Current Facility-Administered Medications  Medication Dose Route Frequency Provider Last Rate Last Dose  . acetaminophen (TYLENOL) tablet 650 mg  650 mg Oral Q6H PRN Patrecia Pour, NP      . alum & mag hydroxide-simeth (MAALOX/MYLANTA) 200-200-20 MG/5ML suspension 30 mL  30 mL Oral Q4H PRN Patrecia Pour, NP      . hydrOXYzine (ATARAX/VISTARIL) tablet 25 mg  25 mg Oral TID PRN Patrecia Pour, NP   25 mg at 11/18/17 1130  . magnesium hydroxide (MILK OF MAGNESIA) suspension 30 mL  30 mL Oral Daily PRN Patrecia Pour, NP      . paliperidone (INVEGA) 24 hr tablet 6 mg  6 mg Oral Daily Money, Lowry Ram, FNP   6 mg at 11/18/17 1218  . traZODone (DESYREL) tablet 100 mg  100 mg Oral QHS PRN Money, Lowry Ram, FNP       PTA Medications: Medications Prior to Admission  Medication Sig Dispense Refill Last Dose  . hydrOXYzine (ATARAX/VISTARIL) 25 MG tablet Take 1 tablet (25 mg total) by mouth 3 (three) times daily as needed for anxiety. 60 tablet 0 unknown  . risperiDONE (RISPERDAL) 1 MG tablet Take 1 tablet (1 mg total) by mouth at bedtime. For mood control 30 tablet 0 11/15/2017 at Unknown time  . traZODone (DESYREL) 50 MG tablet Take 1 tablet (50 mg total) by mouth at bedtime as needed for sleep. 30 tablet 0 11/15/2017 at Unknown time    Musculoskeletal: Strength & Muscle Tone: within normal limits Gait & Station: normal Patient leans: N/A  Psychiatric Specialty Exam: Physical Exam  Nursing note and vitals reviewed. Constitutional: He appears well-developed and well-nourished.  Respiratory: Effort normal.  Musculoskeletal: Normal range of motion.   Neurological: He is alert.  Skin: Skin is warm.    Review of Systems  Constitutional: Negative.   HENT: Negative.   Eyes: Negative.   Respiratory: Negative.   Cardiovascular: Negative.   Gastrointestinal: Negative.   Genitourinary: Negative.   Musculoskeletal: Negative.   Skin: Negative.   Neurological: Negative.   Endo/Heme/Allergies: Negative.   Psychiatric/Behavioral: Negative for depression and suicidal ideas.    Blood pressure (!) 158/100, pulse (!) 118, temperature 97.7 F (36.5 C), temperature source Oral, resp. rate 18, height _0  (1.778 m), weight 105.7 kg (233 lb), SpO2 100 %.Body mass index is 33.43 kg/m.  General Appearance: Casual  Eye Contact:  Fair  Speech:  Pressured  Volume:  Increased  Mood:  Anxious  Affect:  Labile  Thought Process:  Disorganized, Irrelevant and Descriptions of Associations: Loose  Orientation:  Full (Time, Place, and Person)  Thought Content:  Illogical, Delusions, Paranoid  Ideation, Rumination, Tangential and Abstract Reasoning  Suicidal Thoughts:  No  Homicidal Thoughts:  No  Memory:  Immediate;   Good Recent;   Good Remote;   Good  Judgement:  Impaired  Insight:  Lacking  Psychomotor Activity:  Normal  Concentration:  Concentration: Good and Attention Span: Good  Recall:  Good  Fund of Knowledge:  Good  Language:  Good  Akathisia:  No  Handed:  Right  AIMS (if indicated):     Assets:  Communication Skills Desire for Improvement Financial Resources/Insurance Housing Social Support  ADL's:  Intact  Cognition:  WNL  Sleep:  Number of Hours: 6.75    Treatment Plan Summary: Daily contact with patient to assess and evaluate symptoms and progress in treatment, Medication management and Plan is to:  -See MAR and SRA for medication management -Encourage group therapy participation  Observation Level/Precautions:  15 minute checks  Laboratory:  Reviewed  Psychotherapy:  Group therapy  Medications: See [facility]    Consultations:  As needed  Discharge Concerns:  Compliance  Estimated LOS: 3-5 Days  Other:  Admit to [facility] for Primary Diagnosis: Bipolar I disorder, most recent episode (or current) manic ([place]) Long Term Goal(s): Improvement in symptoms so as ready for discharge  Short Term Goals: Ability to demonstrate self-control will improve and Compliance with prescribed medications will improve  Physician Treatment Plan for Secondary Diagnosis: Principal Problem:   Bipolar I disorder, most recent episode (or current) manic ([place]) Active Problems:   Substance-induced psychotic disorder ([place])  Long Term Goal(s): Improvement in symptoms so as ready for discharge  Short Term Goals: Ability to verbalize feelings will improve, Ability to disclose and discuss suicidal ideas and Ability to maintain clinical measurements within normal limits will improve  I certify that inpatient services furnished can reasonably be expected to improve the patient's condition.    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  Patient attributes recent decompensation to using cannabis that was laced with some other drug such as LSD unbeknownst to him.  Admission UDS positive for Cannabis, otherwise negative, admission BAL <10. At this time he presents vaguely irritable, with loose associations, and focusing on writing " a novel".   Patient states he had been seeing a psychiatrist at [facility], and that Bipolar Disorder had been considered as a diagnosis. He reports he had been managed with Risperidone, but had stopped due to concerns about weight gain.  Dx- Bipolar Disorder (  Manic). Cannabis Use Disorder   Plan - inpatient admission. Has been started on Invega for mood disorder . Patient states he has tolerated this medication well thus far .  Trazodone PRNs for insomnia . Will start Ativan PRNs . Check  Lipid Panel, Prolactin , HgbA1C ( routine as on antipsychotic management) Check TSH.  Check EKG to monitor QTc

## 2017-11-18 NOTE — Progress Notes (Signed)
Pt has been asleep since the beginning of the shift.  Per report received, pt was manic and hyperverbal on admission prior to shift change.  He had been given Zyprexa 10 mg [facility].  Admission nurse reported that patient went to bed as soon as he was brought to the unit, and went to sleep.  MHT reports pt has changed positions a few times during the night, but he has not awakened any time.  His respirations are even and unlabored with some intermittent snoring.  Pt remains safe with q15 minute checks.

## 2017-11-18 NOTE — BHH Group Notes (Signed)
LCSW Group Therapy Note 11/18/2017 12:22 PM  Type of Therapy and Topic: Group Therapy: Feelings around Relapse and Recovery  Participation Level: Active   Description of Group:  Patients in this group will discuss emotions they experience before and after a relapse. They will process how experiencing these feelings, or avoidance of experiencing them, relates to having a relapse. Facilitator will guide patients to explore emotions they have related to recovery. Patients will be encouraged to process which emotions are more powerful. They will be guided to discuss the emotional reaction significant others in their lives may have to their relapse or recovery. Patients will be assisted in exploring ways to respond to the emotions of others without this contributing to a relapse.  Therapeutic Goals: 1. Patient will identify two or more emotions that lead to a relapse for them 2. Patient will identify two emotions that result when they relapse 3. Patient will identify two emotions related to recovery 4. Patient will demonstrate ability to communicate their needs through discussion and/or role plays  Summary of Patient Progress:  Kyle RuizJohn was in attendance for the group session. He appeared to be very manic and was often intrusive to his peers during the group's discussion. Kyle Mcmahon was easily redirected, however he continued to interrupt his group members. Kyle Mcmahon was in and out of group.     Therapeutic Modalities:  Cognitive Behavioral Therapy Solution-Focused Therapy Assertiveness Training Relapse Prevention Therapy   Kyle DroughtJolan Garett Mcmahon LCSWA Clinical Social Worker

## 2017-11-18 NOTE — [facility] Suicide Risk Assessment (Addendum)
[facility]   Nursing information obtained from:  Patient Demographic factors:  Male, Cardell PeachGay, lesbian, or bisexual orientation Current Mental Status:  NA Loss Factors:  NA Historical Factors:  Impulsivity Risk Reduction Factors:  Positive social support, Sense of responsibility to family  Total Time spent with patient: 45 minutes Principal Problem: Bipolar I disorder, most recent episode (or current) manic ([facility]) Diagnosis:   Patient Active Problem List   Diagnosis Date Noted  . Bipolar I disorder, most recent episode (or current) manic ([facility]) [F31.10] 11/18/2017  . 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 Behavior change due to substance use [R46.89] 07/11/2017  . Abscess [L02.91] 02/17/2017  . Preventative health care [Z00.00] 08/05/2014    Continued Clinical Symptoms:  Alcohol Use Disorder Identification Test Final Score (AUDIT): 0 The "Alcohol Use Disorders Identification Test", Guidelines for Use in Primary Care, Second Edition.  World Science writerHealth Organization [facility](WHO). Score between 0-7:  no or low risk or alcohol related problems. Score between 8-15:  moderate risk of alcohol related problems. Score between 16-19:  high risk of alcohol related problems. Score 20 or above:  warrants further diagnostic evaluation for alcohol dependence and treatment.   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  Patient attributes recent decompensation to using cannabis that was laced with some other drug such as LSD unbeknownst to him.  Admission UDS positive for Cannabis, otherwise negative, admission BAL <10. At this time he presents vaguely irritable, with loose associations, and focusing on writing " a novel".   Patient states [facility] had [facility], and that Bipolar Disorder had been considered as a diagnosis. He reports he had been managed with Risperidone, but had stopped due to concerns about weight gain.  Dx- Bipolar Disorder ( Manic). Cannabis Use Disorder   Plan - inpatient admission. Has been started on Invega for mood disorder . Patient states he has tolerated this medication well thus far .  Trazodone PRNs for insomnia . Will start Ativan PRNs . Check  Lipid Panel, Prolactin , HgbA1C ( routine as on antipsychotic management) Check TSH.  Check EKG to monitor QTc      Musculoskeletal: Strength & Muscle Tone: within normal limits Gait & Station: normal Patient leans: N/A  Psychiatric Specialty Exam: Physical Exam  ROS no headache, no chest pain, no shortness of breath   Blood pressure (!) 158/100, pulse (!) 118, temperature 97.7 F (36.5 C), temperature source Oral, resp. rate 18, height 5\' 10"  (1.778 m), weight 105.7 kg (233 lb), SpO2 100 %.Body mass index is 33.43 kg/m.  Repeat Vitals 143/81, pulse 109, Pulse ox 99% RA  General Appearance: Fairly Groomed  Eye Contact:  Fair  Speech:  variable, pressured at times   Volume:  Normal  Mood:  manic symptoms as above . Patient states " I know I am manic "  Affect:  irritable  Thought Process:  Disorganized, becomes tangential and loose at times   Orientation:  Full (Time, Place, and Person)  Thought Content:  states he " hears voices of my favorite singers", but does not appear internally preoccupied at present. Grandiose ideations   Suicidal Thoughts:  No currently denies suicidal or self injurious ideations, denies homicidal or violent ideations  Homicidal Thoughts:  No  Memory:  recent and remote  grossly intact   Judgement:  Impaired  Insight:  Fair  Psychomotor Activity:  Normal- no overt psychomotor agitation or restlessness at this time  Concentration:  Concentration: Fair and Attention Span: Fair  Recall:  Good  Fund of  Knowledge:  Good  Language:  Good  Akathisia:  Negative  Handed:  Right  AIMS (if indicated):     Assets:  Desire for Improvement Resilience  ADL's:  Intact  Cognition:  WNL  Sleep:  Number of Hours: 6.75      COGNITIVE FEATURES THAT CONTRIBUTE TO RISK:  Closed-mindedness and Loss of executive function    SUICIDE RISK:   Moderate:  Frequent suicidal ideation with limited intensity, and duration, some specificity in terms of plans, no associated intent, good self-control, limited dysphoria/symptomatology, some risk factors present, and identifiable protective factors, including available and accessible social support.  PLAN OF CARE: Patient will be admitted to inpatient psychiatric unit for stabilization and safety. Will provide and encourage milieu participation. Provide medication management and maked adjustments as needed.  Will follow daily.    I certify that inpatient services furnished can reasonably be expected to improve the patient's condition.   Craige Cotta, MD 11/18/2017, 3:40 PM

## 2017-11-18 NOTE — Tx Team (Signed)
Interdisciplinary Treatment and Diagnostic Plan Update  11/18/2017 Time of Session: 0830AM PIO EATHERLY MRN: [id number]  Principal Diagnosis: Substance Induced Psychotic Disorder  Secondary Diagnoses: Active Problems:   Substance-induced psychotic disorder ([place])   Current Medications:  Current Facility-Administered Medications  Medication Dose Route Frequency Provider Last Rate Last Dose  . acetaminophen (TYLENOL) tablet 650 mg  650 mg Oral Q6H PRN Patrecia Pour, NP      . alum & mag hydroxide-simeth (MAALOX/MYLANTA) 200-200-20 MG/5ML suspension 30 mL  30 mL Oral Q4H PRN Patrecia Pour, NP      . hydrOXYzine (ATARAX/VISTARIL) tablet 25 mg  25 mg Oral TID PRN Patrecia Pour, NP      . magnesium hydroxide (MILK OF MAGNESIA) suspension 30 mL  30 mL Oral Daily PRN Patrecia Pour, NP      . risperiDONE (RISPERDAL) tablet 1 mg  1 mg Oral QHS Patrecia Pour, NP      . traZODone (DESYREL) tablet 50 mg  50 mg Oral QHS PRN Patrecia Pour, NP       PTA Medications: Medications Prior to Admission  Medication Sig Dispense Refill Last Dose  . hydrOXYzine (ATARAX/VISTARIL) 25 MG tablet Take 1 tablet (25 mg total) by mouth 3 (three) times daily as needed for anxiety. 60 tablet 0 unknown  . risperiDONE (RISPERDAL) 1 MG tablet Take 1 tablet (1 mg total) by mouth at bedtime. For mood control 30 tablet 0 11/15/2017 at Unknown time  . traZODone (DESYREL) 50 MG tablet Take 1 tablet (50 mg total) by mouth at bedtime as needed for sleep. 30 tablet 0 11/15/2017 at Unknown time    Patient Stressors: Educational concerns Substance abuse  Patient Strengths: Occupational psychologist fund of knowledge Physical Health  Treatment Modalities: Medication Management, Group therapy, Case management,  1 to 1 session with clinician, Psychoeducation, Recreational therapy.   Physician Treatment Plan for Primary Diagnosis: <principal problem not specified>  Medication Management: Evaluate  patient's response, side effects, and tolerance of medication regimen.  Therapeutic Interventions: 1 to 1 sessions, Unit Group sessions and Medication administration.  Evaluation of Outcomes: Not Met  Physician Treatment Plan for Secondary Diagnosis: Active Problems:   Substance-induced psychotic disorder ([place])  Medication Management: Evaluate patient's response, side effects, and tolerance of medication regimen.  Therapeutic Interventions: 1 to 1 sessions, Unit Group sessions and Medication administration.  Evaluation of Outcomes: Not Met   RN Treatment Plan for Primary Diagnosis: Substance Induced Psychotic Disorder Long Term Goal(s): Knowledge of disease and therapeutic regimen to maintain health will improve  Short Term Goals: Ability to remain free from injury will improve, Ability to disclose and discuss suicidal ideas and Ability to identify and develop effective coping behaviors will improve  Medication Management: RN will administer medications as ordered by provider, will assess and evaluate patient's response and provide education to patient for prescribed medication. RN will report any adverse and/or side effects to prescribing provider.  Therapeutic Interventions: 1 on 1 counseling sessions, Psychoeducation, Medication administration, Evaluate responses to treatment, Monitor vital signs and CBGs as ordered, Perform/monitor CIWA, COWS, AIMS and Fall Risk screenings as ordered, Perform wound care treatments as ordered.  Evaluation of Outcomes: Not Met   LCSW Treatment Plan for Primary Diagnosis: Substance Induced Psychotic Disorder Long Term Goal(s): Safe transition to appropriate next level of care at discharge, Engage patient in therapeutic group addressing interpersonal concerns.  Short Term Goals: Engage patient in aftercare planning with referrals and resources, Facilitate patient  progression through stages of change regarding substance use diagnoses and concerns and  Identify triggers associated with mental health/substance abuse issues  Therapeutic Interventions: Assess for all discharge needs, 1 to 1 time with Social worker, Explore available resources and support systems, Assess for adequacy in community support network, Educate family and significant other(s) on suicide prevention, Complete Psychosocial Assessment, Interpersonal group therapy.  Evaluation of Outcomes: Not Met   Progress in Treatment: Attending groups: No. Participating in groups: No. New to unit. Continuing to assess.  Taking medication as prescribed: Yes. Toleration medication: Yes. Family/Significant other contact made: No, will contact:  family member if patient consents to collateral contact.  Patient understands diagnosis: Yes. Discussing patient identified problems/goals with staff: Yes. Medical problems stabilized or resolved: Yes. Denies suicidal/homicidal ideation: Yes. Issues/concerns per patient self-inventory: No. Other: n/a   New problem(s) identified: No, Describe:  n/a  New Short Term/Long Term Goal(s): detox, medication management for mood stabilization; elimination of SI thoughts; development of comprehensive mental wellness/sobriety plan.   Patient Goal: "To grow as a person and get well."   Discharge Plan or Barriers: CSW assessing for appropriate referrals. Pt currently sees Dr. Gregary Cromer but is unsure if that last name is correct. Pt was last admitted to [facility] 08/2017 and was referred to Counseling and Psychiatric Services at [facility].   Reason for Continuation of Hospitalization: Hallucinations Mania Medication stabilization Suicidal ideation  Estimated Length of Stay: Tuesday, 3/12   Attendees: Patient: Kyle Mcmahon 11/18/2017 8:18 AM  Physician: Dr. Parke Poisson MD 11/18/2017 8:18 AM  Nursing: Kieth Brightly RN 11/18/2017 8:18 AM  RN Care Manager: x 11/18/2017 8:18 AM  Social Worker: Maxie Better, LCSW 11/18/2017 8:18 AM  Recreational Therapist: x 11/18/2017  8:18 AM  Other: Marvia Pickles NP; Lindell Spar NP 11/18/2017 8:18 AM  Other:  11/18/2017 8:18 AM  Other: 11/18/2017 8:18 AM    Scribe for Treatment Team: [place], LCSW 11/18/2017 8:18 AM

## 2017-11-18 NOTE — Progress Notes (Signed)
[facility] Group Notes:  (Nursing/MHT/Case Management/Adjunct)  Date:  11/18/2017  Time:  4:38 PM  Type of Therapy:  Nurse Education  Participation Level:  Active  Participation Quality:  Intrusive  Affect:  Anxious and Labile  Cognitive:  Alert and Lacking  Insight:  Lacking  Engagement in Group:  Off Topic  Modes of Intervention:  Activity, Education and Support  Summary of Progress/Problems:The purpose of this group is to educate and support patients on the benefits of relaxation techniques. The patient was tangential and required redirection.  Beatrix ShipperWright, Kyle Mcmahon 11/18/2017, 4:38 PM
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
## 2017-11-18 NOTE — Progress Notes (Signed)
Data. Patient denies HI. Endorses passive SI. "I have thought about is some and can  verbally contracts for safety on the unit and to come to staff before acting of any self harm thoughts/feelings/voices. Also patient has been acting in a bizarre manner through out shift. patient was being taken to the EKG room and on the way patient began spontaneously crying and said, "I really love Transformers." While having the EKG preformed, patient stated, "I am Kyle Mcmahon, she is me. She is an Probation officerairbender.  Kyle Mcmahon can kill you by using pressure points on your body." Nurse stated to patient, please don't do that, we have to do this EKG and DR wants to know your heart is OK and how would he know then. Patient responded, "Oh, no, I wouldn't kill you. I like you."  Patient was also noted standing at the wall chart, "reading" it with his finger and standing very close, almost with his nose touching. Patient has been redirected multiple times today to not go to 400 hall day room, but to stay on 300 hall. MD notified and patient moved to 500 hall.   Action. Emotional support and encouragement offered. Education provided on medication, indications and side effect. Q 15 minute checks done for safety. Response. Safety on the unit maintained through 15 minute checks.  Medications taken as prescribed. Attended groups.

## 2017-11-18 NOTE — Progress Notes (Signed)
Recreation Therapy Notes  Date: 11/18/17 Time: 0930 Location: 300 Hall Dayroom  Group Topic: Stress Management  Goal Area(s) Addresses:  Patient will verbalize importance of using healthy stress management.  Patient will identify positive emotions associated with healthy stress management.   Intervention: Stress Management  Activity :  Unum[employer]identMountain Meditation.  LRT played a meditation that explained the concept of taking on the characteristics of mountains (resilience, steady, immoveable) in daily life.  Patients were to follow along as the meditation was read to engage in the practice.  Education:  Stress Management, Discharge Planning.   Education Outcome: Acknowledges edcuation/In group clarification offered/Needs additional education  Clinical Observations/Feedback: Pt did not attend group.    Caroll RancherMarjette Coulton Schlink, LRT/CTRS         Lillia AbedLindsay, Laramie Gelles A 11/18/2017 10:47 AM

## 2017-11-19 DIAGNOSIS — F1290 Cannabis use, unspecified, uncomplicated: Secondary | ICD-10-CM

## 2017-11-19 DIAGNOSIS — F419 Anxiety disorder, unspecified: Secondary | ICD-10-CM

## 2017-11-19 DIAGNOSIS — R450 Nervousness: Secondary | ICD-10-CM

## 2017-11-19 DIAGNOSIS — G4700 Insomnia, unspecified: Secondary | ICD-10-CM

## 2017-11-19 LAB — PROLACTIN: Prolactin: 43.9 ng/mL — ABNORMAL HIGH (ref 4.0–15.2)

## 2017-11-19 LAB — LIPID PANEL
CHOL/HDL RATIO: 4.4 ratio
CHOLESTEROL: 161 mg/dL (ref 0–200)
HDL: 37 mg/dL — ABNORMAL LOW (ref >40)
LDL Cholesterol: 112 mg/dL — ABNORMAL HIGH (ref 0–99)
TRIGLYCERIDES: 59 mg/dL (ref <150)
VLDL: 12 mg/dL (ref 0–40)

## 2017-11-19 LAB — TSH: TSH: 1.948 u[IU]/mL (ref 0.350–4.500)

## 2017-11-19 MED ORDER — LORAZEPAM 1 MG PO TABS: 1.0000 mg | ORAL_TABLET | Freq: Four times a day (QID) | ORAL | Status: DC | PRN

## 2017-11-19 MED ORDER — PALIPERIDONE ER 6 MG PO TB24
6.0000 mg | ORAL_TABLET | Freq: Every day | ORAL | Status: DC
  Filled 2017-11-19: qty 1

## 2017-11-19 MED ORDER — BENZTROPINE MESYLATE 0.5 MG PO TABS
0.5000 mg | ORAL_TABLET | Freq: Every day | ORAL | Status: DC
  Administered 2017-11-19: 0.5 mg via ORAL
  Filled 2017-11-19 (×3): qty 1

## 2017-11-19 NOTE — [facility] Group Notes (Signed)
LCSW Group Therapy Note  11/19/2017   9:30-10:30am (300 hall)                10:30-11:30am (400 hall)                11:30am-12:00pm (500 hall)  Type of Therapy and Topic:  Group Therapy: Anger Cues and Responses  Participation Level:  Minimal   Description of Group:   In this group, patients learned how to recognize the physical, cognitive, emotional, and behavioral responses they have to anger-provoking situations.  They identified a recent time they became angry and how they reacted.  They analyzed how their reaction was possibly beneficial and how it was possibly unhelpful.  The group discussed a variety of healthier coping skills that could help with such a situation in the future.  Deep breathing was practiced briefly.  Therapeutic Goals: 1. Patients will remember their last incident of anger and how they felt emotionally and physically, what their thoughts were at the time, and how they behaved. 2. Patients will identify how their behavior at that time worked for them, as well as how it worked against them. 3. Patients will explore possible new behaviors to use in future anger situations. 4. Patients will learn that anger itself is normal and cannot be eliminated, and that healthier reactions can assist with resolving conflict rather than worsening situations.  Summary of Patient Progress:  The patient shared that his most recent time of anger was yesterday and said he was in the gym and did not really become angry, but did feel a rush of testosterone and punched the wall only after confirming it was soft/covered.  He left the room after a little while, stating he wanted to go take a nap.  Therapeutic Modalities:   Cognitive Behavioral Therapy  Kyle Mcmahon  11/19/2017 12:00pm

## 2017-11-19 NOTE — Progress Notes (Signed)
Adult Psychoeducational Group Note  Date:  11/19/2017 Time:  8:28 PM  Group Topic/Focus:  Wrap-Up Group:   The focus of this group is to help patients review their daily goal of treatment and discuss progress on daily workbooks.  Participation Level:  Active  Participation Quality:  Appropriate  Affect:  Appropriate  Cognitive:  Appropriate  Insight: Appropriate  Engagement in Group:  Engaged  Modes of Intervention:  Discussion  Additional Comments:  The patient expressed that he rates today a 10.The patient also said that he attended music therapy.  Octavio Mannshigpen, My Rinke Lee 11/19/2017, 8:28 PM

## 2017-11-19 NOTE — Progress Notes (Signed)
D Patient is seen sitting  in the dayroom this morning . He brightens upon approach when this Clinical research associatewriter approached him . A He completed his daily assessment and on this he wrote  He denied SI today and he rated his depression, hopelessness and anxeity " 0/0/0/", respectively. R Safety is in place.

## 2017-11-19 NOTE — [facility] Group Notes (Signed)
[facility] Group Notes:  (Nursing/MHT/Case Management/Adjunct)  Date:  11/19/2017  Time:  9:37 AM  Type of Therapy:  Patient self inventory group and goals group  Participation Level:  Active  Participation Quality:  Inattentive  Affect:  Anxious and Resistant  Cognitive:  Disorganized  Insight:  Lacking  Engagement in Group:  Lacking  Modes of Intervention:  Education  Summary of Progress/Problems: Pt did attend patient self inventory group and goals group.   Kyle Mcmahon, Kyle Mcmahon 11/19/2017, 9:37 AM

## 2017-11-19 NOTE — [facility] Group Notes (Signed)
[facility] Group Notes:  (Nursing/MHT/Case Management/Adjunct)  Date:  11/19/2017  Time:  3:02 PM  Type of Therapy:  Psychoeducational Skills  Participation Level:  Did Not Attend  Participation Quality:  Did not attend  Affect:  Did not attend  Cognitive:  Did not attend  Insight:  None  Engagement in Group:  Did not attend  Modes of Intervention:  Did not attend  Summary of Progress/Problems: Pt did not attend Psychoeducational group with music.   Blanca Thornton Shanta 11/19/2017, 3:02 PM 

## 2017-11-19 NOTE — Progress Notes (Addendum)
[facility] MD Progress Note  11/19/2017 11:40 AM DIERKS WACH  MRN:  [id number] Subjective:  Kyle Mcmahon reports " I am doing well, I just want to remain connected with the astrology world." hyper verbal but pleasant.   Objective: Kyle Mcmahon is awake, alert and oriented. Patient present with disorganized and tangential thoughts. Kyle Mcmahon continues to discusses astrology and continues to be redirection with questions. Reports he slept well with the trazodone.  Denies suicidal or homicidal ideation. Reports " I heard sound and birds and songs"  When asked about  auditory or visual hallucination and does not appear to be responding to internal stimuli. Patient is pressure and tangential. Reports taken medications as prescribed and tolerating medications well. States "some tiredness"  Support, encouragement and reassurance was provided.   Principal Problem: Bipolar I disorder, most recent episode (or current) manic (HCC) Diagnosis:   Patient Active Problem List   Diagnosis Date Noted  . Bipolar I disorder, most recent episode (or current) manic (HCC) [F31.10] 11/18/2017  . 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 Behavior change due to substance use [R46.89] 07/11/2017  . Abscess [L02.91] 02/17/2017  . Preventative health care [Z00.00] 08/05/2014   Total Time spent with patient: 20 minutes  Past Psychiatric History:   Past Medical History:  Past Medical History:  Diagnosis Date  . Depression     Past Surgical History:  Procedure Laterality Date  . HERNIA REPAIR     baby  . 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 Bipolar disorder Mother    Family Psychiatric  History:  Social History:  Social History   Substance and Sexual Activity  Alcohol Use Yes   Comment: 3-4 drinks 2-4 x per month     Social History   Substance and Sexual Activity  Drug Use Yes   Comment: Pt endorsed Marijuana  use; UDS was clear    Social History   Socioeconomic History  . Marital status: Single    Spouse name: None  . Number of children: None  . Years of education: None  . Highest education level: None  Social Needs  . Financial resource strain: None  . Food insecurity - worry: None  . Food insecurity - inability: None  . Transportation needs - medical: None  . Transportation needs - non-medical: None  Occupational History  . None  Tobacco Use  . Smoking status: Never Smoker  . Smokeless tobacco: Never Used  Substance and Sexual Activity  . Alcohol use: Yes    Comment: 3-4 drinks 2-4 x per month  . Drug use: Yes    Comment: Pt endorsed Marijuana use; UDS was clear  . Sexual activity: Not Currently  Other Topics Concern  . None  Social History Narrative   Lives with mother sister and maternal aunt.   Additional Social History:                         Sleep: Fair  Appetite:  Fair  Current Medications: Current Facility-Administered Medications  Medication Dose Route Frequency Provider Last Rate Last Dose  . acetaminophen (TYLENOL) tablet 650 mg  650 mg Oral Q6H PRN Charm Rings, NP      . alum & mag hydroxide-simeth (MAALOX/MYLANTA) 200-200-20 MG/5ML suspension 30 mL  30 mL Oral Q4H PRN Charm Rings, NP      . benztropine (  COGENTIN) tablet 0.5 mg  0.5 mg Oral QHS Oneta Rack, NP      . LORazepam (ATIVAN) tablet 1 mg  1 mg Oral Q8H PRN Remona Boom, Rockey Situ, MD   1 mg at 11/19/17 1100  . magnesium hydroxide (MILK OF MAGNESIA) suspension 30 mL  30 mL Oral Daily PRN Charm Rings, NP   30 mL at 11/19/17 0615  . [START ON 11/20/2017] paliperidone (INVEGA) 24 hr tablet 6 mg  6 mg Oral QHS Oneta Rack, NP      . traZODone (DESYREL) tablet 50 mg  50 mg Oral QHS,MR X 1 Nira Conn A, NP   50 mg at 11/18/17 2242    Lab Results:  Results for orders placed or performed during the hospital encounter of 11/17/17 (from the past 48 hour(s))  Prolactin     Status:  Abnormal   Collection Time: 11/18/17  6:30 PM  Result Value Ref Range   Prolactin 43.9 (H) 4.0 - 15.2 ng/mL    Comment: (NOTE) Performed At: [facility] [address] [place], [place] [id number] Jolene Schimke MD [id number] Performed at [facility], [address] [place], [place] [address]   Lipid panel     Status: Abnormal   Collection Time: 11/19/17  6:25 AM  Result Value Ref Range   Cholesterol 161 0 - 200 mg/dL   Triglycerides 59 <308 mg/dL   HDL 37 (L) >65 mg/dL   Total CHOL/HDL Ratio 4.4 RATIO   VLDL 12 0 - 40 mg/dL   LDL Cholesterol 784 (H) 0 - 99 mg/dL    Comment:        Total Cholesterol/HDL:CHD Risk Coronary Heart Disease Risk Table                     Men   Women  1/2 Average Risk   3.4   3.3  Average Risk       5.0   4.4  2 X Average Risk   9.6   7.1  3 X Average Risk  23.4   11.0        Use the calculated Patient Ratio above and the CHD Risk Table to determine the patient's CHD Risk.        ATP III CLASSIFICATION (LDL):  <100     mg/dL   Optimal  696-295  mg/dL   Near or Above                    Optimal  130-159  mg/dL   Borderline  284-132  mg/dL   High  >440     mg/dL   Very High Performed at [facility], [address], [place], [place] [address]   TSH     Status: None   Collection Time: 11/19/17  6:25 AM  Result Value Ref Range   TSH 1.948 0.350 - 4.500 uIU/mL    Comment: Performed by a 3rd Generation assay with a functional sensitivity of <=0.01 uIU/mL. Performed at [facility], [address] [place], [place] [address]     Blood Alcohol level:  Lab Results  Component Value Date   ETH <10 11/16/2017   ETH <10 07/10/2017    Metabolic Disorder Labs: No results found for: HGBA1C, MPG Lab Results  Component Value Date   PROLACTIN 43.9 (H) 11/18/2017   Lab Results  Component Value Date   CHOL 161 11/19/2017   TRIG 59 11/19/2017  HDL 37 (L) 11/19/2017   CHOLHDL  4.4 11/19/2017   VLDL 12 11/19/2017   LDLCALC 112 (H) 11/19/2017    Physical Findings: AIMS: Facial and Oral Movements Muscles of Facial Expression: None, normal Lips and Perioral Area: None, normal Jaw: None, normal Tongue: None, normal,Extremity Movements Upper (arms, wrists, hands, fingers): None, normal Lower (legs, knees, ankles, toes): None, normal, Trunk Movements Neck, shoulders, hips: None, normal, Overall Severity Severity of abnormal movements (highest score from questions above): None, normal Incapacitation due to abnormal movements: None, normal Patient's awareness of abnormal movements (rate only patient's report): No Awareness, Dental Status Current problems with teeth and/or dentures?: No Does patient usually wear dentures?: No  CIWA:    COWS:     Musculoskeletal: Strength & Muscle Tone: within normal limits Gait & Station: normal Patient leans: N/A  Psychiatric Specialty Exam: Physical Exam  Vitals reviewed. Constitutional: He appears well-developed.  Cardiovascular: Normal rate.  Neurological: He is alert.  Psychiatric: He has a normal mood and affect. His behavior is normal.    Review of Systems  Psychiatric/Behavioral: Positive for depression. Negative for suicidal ideas. The patient is nervous/anxious.   All other systems reviewed and are negative.   Blood pressure 116/78, pulse (!) 113, temperature 97.7 F (36.5 C), temperature source Oral, resp. rate 20, height 5\' 10"  (1.778 m), weight 105.7 kg (233 lb), SpO2 100 %.Body mass index is 33.43 kg/m.  General Appearance: Casual  Eye Contact:  Good  Speech:  Clear and Coherent and Pressured  Volume:  Increased  Mood:  Anxious  Affect:  Labile  Thought Process:  Linear and Descriptions of Associations: Tangential  Orientation:  Full (Time, Place, and Person)  Thought Content:  Hallucinations: Auditory and Paranoid Ideation  Suicidal Thoughts:  No  Homicidal Thoughts:  No  Memory:  Immediate;    Fair Recent;   Fair Remote;   Fair  Judgement:  Impaired  Insight:  Lacking  Psychomotor Activity:  Normal  Concentration:  Concentration: Fair  Recall:  [employer]Fair  Fund of Knowledge:  Fair  Language:  Good  Akathisia:  No  Handed:  Right  AIMS (if indicated):     Assets:  Communication Skills Desire for Improvement Social Support  ADL's:  Intact  Cognition:  WNL  Sleep:  Number of Hours: 6.5     Treatment Plan Summary: Daily contact with patient to assess and evaluate symptoms and progress in treatment and Medication management     Mood stabilization.   Continue Invega 6mg  PO QHS  Continue cogentin 0.5mg  PO QHS  Anxiety:   Continue Ativan 1mg  PO Q 8 PRN   Insomina   Continue with Trazodone 100 mg    Will continue to monitor vitals ,medication compliance and treatment side effects while patient is here.  Reviewed labs: TSH 1.948 BAL - , UDS -  CSW will start working on disposition.  Patient to participate in therapeutic milieu   Oneta Rackanika N Lewis, NP 11/19/2017, 11:40 AM   Agree with NP Progress Note

## 2017-11-19 NOTE — Progress Notes (Signed)
Writer has observed patient in the dayroom singing while others are trying to watch TV and will go in his room and sing even louder. Patient had to be redirected numerous times tonight. He complained of his mind racing after he had taken his sleep medication. He received a prn of ativan which was effective. Support given and safety maintained on unit with 15 min checks.

## 2017-11-19 NOTE — [facility] Group Notes (Signed)
[facility] Group Notes:  (Nursing/MHT/Case Management/Adjunct)  Date:  11/19/2017  Time:  10:16 AM  Type of Therapy:  Psychoeducational Skills  Participation Level:  Active  Participation Quality:  Appropriate  Affect:  Appropriate  Cognitive:  Appropriate  Insight:  Appropriate  Engagement in Group:  Engaged  Modes of Intervention:  Problem-solving  Summary of Progress/Problems: Pt attended Psychoeducational group with top topic anger management.   Jacquelyne BalintForrest, Kipton Skillen Shanta 11/19/2017, 10:16 AM

## 2017-11-20 LAB — HEMOGLOBIN A1C
Hgb A1c MFr Bld: 5.7 % — ABNORMAL HIGH (ref 4.8–5.6)
MEAN PLASMA GLUCOSE: 117 mg/dL

## 2017-11-20 MED ORDER — ASENAPINE MALEATE 5 MG SL SUBL
5.0000 mg | SUBLINGUAL_TABLET | Freq: Two times a day (BID) | SUBLINGUAL | Status: DC
  Filled 2017-11-20: qty 1

## 2017-11-20 MED ORDER — DIVALPROEX SODIUM ER 500 MG PO TB24
500.0000 mg | ORAL_TABLET | Freq: Every day | ORAL | Status: DC
  Administered 2017-11-20 – 2017-11-22 (×3): 500 mg via ORAL
  Filled 2017-11-20 (×6): qty 1

## 2017-11-20 MED ORDER — LORAZEPAM 1 MG PO TABS
1.0000 mg | ORAL_TABLET | Freq: Three times a day (TID) | ORAL | Status: DC | PRN
  Administered 2017-11-20: 1 mg via ORAL
  Filled 2017-11-20: qty 1

## 2017-11-20 MED ORDER — PALIPERIDONE ER 6 MG PO TB24
6.0000 mg | ORAL_TABLET | Freq: Every day | ORAL | Status: DC
  Administered 2017-11-20 – 2017-11-21 (×2): 6 mg via ORAL
  Filled 2017-11-20 (×4): qty 1

## 2017-11-20 MED ORDER — LORAZEPAM 1 MG PO TABS
1.0000 mg | ORAL_TABLET | Freq: Two times a day (BID) | ORAL | Status: DC
  Administered 2017-11-20 – 2017-11-21 (×3): 1 mg via ORAL
  Filled 2017-11-20 (×3): qty 1

## 2017-11-20 NOTE — Progress Notes (Signed)
D: Pt denies SI/HI/AVH. Pt is pleasant and cooperative. Pt focused on D/C. Pt keeps to himself most of the evening.   A: Pt was offered support and encouragement. Pt was given scheduled medications. Pt was encourage to attend groups. Q 15 minute checks were done for safety.   R:Pt attends groups and interacts well with peers and staff. Pt is taking medication. Pt receptive to treatment and safety maintained on unit.

## 2017-11-20 NOTE — [facility] Group Notes (Signed)
[facility] Group Notes:  (Nursing/MHT/Case Management/Adjunct)  Date:  11/20/2017  Time:  10:12 AM  Type of Therapy:  Goals/Orientation Group.  Participation Level:  Active  Participation Quality:  Appropriate  Affect:  Appropriate  Cognitive:  Appropriate  Insight:  Appropriate  Engagement in Group:  Engaged  Modes of Intervention:  Discussion  Summary of Progress/Problems: Pt attended goals/orientation group, pt was receptive.   Jacquelyne BalintForrest, Kimm Ungaro Shanta 11/20/2017, 10:12 AM

## 2017-11-20 NOTE — Progress Notes (Signed)
Writer has observed patient up in the dayroom most of the evening. He had family to visit on tonight . He attended group and has been much calmer and quieter this evening. He attended group and watched tv in the dayroom until it closed. He reports to Clinical research associatewriter that he has had a good day. He inquired about his medications and writer explained to him his medications due for tonight. His thought process is clearer and not as rambled as the previous night. Support given and safety maintained on unit with 15 min checks.

## 2017-11-20 NOTE — Progress Notes (Signed)
[facility] MD Progress Note  11/20/2017 9:50 AM ESAUL DORWART  MRN:  [id number] Subjective: patient reports " I am OK". Denies medication side effects   Objective:  I have discussed case with RN staff and have met with patient. Nursing report is that patient presents with some improvement compared to admission, and that he is more redirectable.  He does continue to present with  ongoing symptoms of mania- at times singing loudly in hallway, presenting with pressured speech , vague irritability, although no threatening or overtly agitated behaviors. Denies suicidal ideations. Denies hallucinations, and does not appear internally preoccupied . Sleep partially improved but still fragmented. Patient has given me express consent to speak with his mother, Ms Crossen at [phone]. She has been visiting him on unit and she confirms patient is still clearly manic, and far from his baseline level of functioning . She confirms a family history of Bipolar Disorder and states  she has also been diagnosed with this condition. She also  worries about his cannabis use and also states he has been drinking alcohol more often recently  We have reviewed  medication options with patient- he states he feels Lorayne Bender is helping and well tolerated thus far. He agrees to adding Depakote ER . Side effects reviewed .  Labs reviewed as below.  Principal Problem: Bipolar I disorder, most recent episode (or current) manic ([place]) Diagnosis:   Patient Active Problem List   Diagnosis Date Noted  . Bipolar I disorder, most recent episode (or current) manic ([place]) [F31.10] 11/18/2017  . Substance-induced psychotic disorder ([place]) [F19.959] 07/12/2017  . Behavior change due to substance use [R46.89] 07/11/2017  . Abscess [L02.91] 02/17/2017  . Preventative health care [Z00.00] 08/05/2014   Total Time spent with patient: 25 minutes   Past Psychiatric History:   Past Medical History:  Past Medical History:  Diagnosis Date  .  Depression     Past Surgical History:  Procedure Laterality Date  . HERNIA REPAIR     baby  . 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 Bipolar disorder Mother    Family Psychiatric  History:  Social History:  Social History   Substance and Sexual Activity  Alcohol Use Yes   Comment: 3-4 drinks 2-4 x per month     Social History   Substance and Sexual Activity  Drug Use Yes   Comment: Pt endorsed Marijuana use; UDS was clear    Social History   Socioeconomic History  . Marital status: Single    Spouse name: None  . Number of children: None  . Years of education: None  . Highest education level: None  Social Needs  . Financial resource strain: None  . Food insecurity - worry: None  . Food insecurity - inability: None  . Transportation needs - medical: None  . Transportation needs - non-medical: None  Occupational History  . None  Tobacco Use  . Smoking status: Never Smoker  . Smokeless tobacco: Never Used  Substance and Sexual Activity  . Alcohol use: Yes    Comment: 3-4 drinks 2-4 x per month  . Drug use: Yes    Comment: Pt endorsed Marijuana use; UDS was clear  . Sexual activity: Not Currently  Other Topics Concern  . None  Social History Narrative   Lives with mother sister and maternal aunt.   Additional Social History:   Sleep:  Fair  Appetite:  Fair  Current Medications: Current Facility-Administered Medications  Medication Dose Route Frequency Provider Last Rate Last Dose  . acetaminophen (TYLENOL) tablet 650 mg  650 mg Oral Q6H PRN Patrecia Pour, NP      . alum & mag hydroxide-simeth (MAALOX/MYLANTA) 200-200-20 MG/5ML suspension 30 mL  30 mL Oral Q4H PRN Patrecia Pour, NP      . divalproex (DEPAKOTE ER) 24 hr tablet 500 mg  500 mg Oral Daily Cobos, Myer Peer, MD      . LORazepam (ATIVAN) tablet 1 mg  1 mg Oral Q6H PRN Cobos, Myer Peer, MD      . LORazepam  (ATIVAN) tablet 1 mg  1 mg Oral BID Cobos, Fernando A, MD      . magnesium hydroxide (MILK OF MAGNESIA) suspension 30 mL  30 mL Oral Daily PRN Patrecia Pour, NP   30 mL at 11/19/17 0615  . paliperidone (INVEGA) 24 hr tablet 6 mg  6 mg Oral QHS Cobos, Myer Peer, MD        Lab Results:  Results for orders placed or performed during the hospital encounter of 11/17/17 (from the past 48 hour(s))  Prolactin     Status: Abnormal   Collection Time: 11/18/17  6:30 PM  Result Value Ref Range   Prolactin 43.9 (H) 4.0 - 15.2 ng/mL    Comment: (NOTE) Performed At: [facility] [place], [place] [id number] Rush Farmer MD [id number] Performed at [facility], [place] [address] [place], [place] [address]   Lipid panel     Status: Abnormal   Collection Time: 11/19/17  6:25 AM  Result Value Ref Range   Cholesterol 161 0 - 200 mg/dL   Triglycerides 59 <150 mg/dL   HDL 37 (L) >40 mg/dL   Total CHOL/HDL Ratio 4.4 RATIO   VLDL 12 0 - 40 mg/dL   LDL Cholesterol 112 (H) 0 - 99 mg/dL    Comment:        Total Cholesterol/HDL:CHD Risk Coronary Heart Disease Risk Table                     Men   Women  1/2 Average Risk   3.4   3.3  Average Risk       5.0   4.4  2 X Average Risk   9.6   7.1  3 X Average Risk  23.4   11.0        Use the calculated Patient Ratio above and the CHD Risk Table to determine the patient's CHD Risk.        ATP III CLASSIFICATION (LDL):  <100     mg/dL   Optimal  100-129  mg/dL   Near or Above                    Optimal  130-159  mg/dL   Borderline  160-189  mg/dL   High  >190     mg/dL   Very High Performed at [facility] [address] [place], [place] [address]   TSH     Status: None   Collection Time: 11/19/17  6:25 AM  Result Value Ref Range   TSH 1.948 0.350 - 4.500 uIU/mL    Comment: Performed by a 3rd Generation assay with a functional sensitivity of <=0.01 uIU/mL. Performed at [facility], [place] [address], [place], [place] [address]  Blood Alcohol level:  Lab Results  Component Value Date   ETH <10 11/16/2017   ETH <10 16/06/9603    Metabolic Disorder Labs: No results found for: HGBA1C, MPG Lab Results  Component Value Date   PROLACTIN 43.9 (H) 11/18/2017   Lab Results  Component Value Date   CHOL 161 11/19/2017   TRIG 59 11/19/2017   HDL 37 (L) 11/19/2017   CHOLHDL 4.4 11/19/2017   VLDL 12 11/19/2017   LDLCALC 112 (H) 11/19/2017    Physical Findings: AIMS: Facial and Oral Movements Muscles of Facial Expression: None, normal Lips and Perioral Area: None, normal Jaw: None, normal Tongue: None, normal,Extremity Movements Upper (arms, wrists, hands, fingers): None, normal Lower (legs, knees, ankles, toes): None, normal, Trunk Movements Neck, shoulders, hips: None, normal, Overall Severity Severity of abnormal movements (highest score from questions above): None, normal Incapacitation due to abnormal movements: None, normal Patient's awareness of abnormal movements (rate only patient's report): No Awareness, Dental Status Current problems with teeth and/or dentures?: No Does patient usually wear dentures?: No  CIWA:    COWS:     Musculoskeletal: Strength & Muscle Tone: within normal limits Gait & Station: normal Patient leans: N/A  Psychiatric Specialty Exam: Physical Exam  Vitals reviewed. Constitutional: He appears well-developed.  Cardiovascular: Normal rate.  Neurological: He is alert.  Psychiatric: He has a normal mood and affect. His behavior is normal.    Review of Systems  Psychiatric/Behavioral: Positive for depression. Negative for suicidal ideas. The patient is nervous/anxious.   All other systems reviewed and are negative. denies chest pain , no shortness of breath, no vomiting   Blood pressure 118/77, pulse 98, temperature (!) 97.3 F (36.3 C), temperature source Oral, resp. rate 20, height '5\' 10"'$  (1.778 m),  weight 105.7 kg (233 lb), SpO2 100 %.Body mass index is 33.43 kg/m.  General Appearance: Fairly Groomed  Eye Contact:  Good  Speech:  pressured at times   Volume:  Normal  Mood:  continues to present with mania  Affect:  less irritable today  Thought Process:  Goal Directed and Descriptions of Associations: Tangential  Orientation:  Other:  fully alert and attentive   Thought Content:  denies hallucinations, no delusions expressed at this time.  Suicidal Thoughts:  No denies suicidal or self injurious ideations, denies homicidal or violent ideations  Homicidal Thoughts:  No  Memory:  recent and remote grossly intact   Judgement:  Other:  fair  Insight:  Fair- partially improved, states he realizes he is " manic".  Psychomotor Activity:  intermittently restless, pacing at times   Concentration:  Concentration: Fair and Attention Span: Fair  Recall:  [employer] of Knowledge:  Fair  Language:  Good  Akathisia:  No  Handed:  Right  AIMS (if indicated):     Assets:  Communication Skills Desire for Improvement Social Support  ADL's:  Intact  Cognition:  WNL  Sleep:  Number of Hours: 5.25   Assessment - patient continues to present with mania, disorganized behaviors, pressured speech. As discussed with staff, he is more redirectable and less irritable . Mother confirms he is still far from his baseline and that there is a family history of Bipolarity . Thus far he is tolerating Invega well, denies side effects, and is in agreement with adding Depakote for further management. 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   MRN: [id number]

## 2017-11-20 NOTE — [facility] Group Notes (Signed)
[facility] Group Notes:  (Nursing/MHT/Case Management/Adjunct)  Date:  11/20/2017  Time:  10:16 AM  Type of Therapy:  Psychoeducational Skills  Participation Level:  Active  Participation Quality:  Appropriate  Affect:  Appropriate  Cognitive:  Appropriate  Insight:  Appropriate  Engagement in Group:  Engaged  Modes of Intervention:  Problem-solving  Summary of Progress/Problems: Pt attended Psychoeducational group with top topic healthy support systems.   Jorene Kaylor Shanta 11/20/2017, 10:16 AM 

## 2017-11-20 NOTE — [facility] Group Notes (Signed)
[facility] Group Notes:  (Nursing/MHT/Case Management/Adjunct)  Date:  11/20/2017  Time:  2:56 PM  Type of Therapy:  Psychoeducational Skills  Participation Level:  Active  Participation Quality:  Appropriate  Affect:  Appropriate  Cognitive:  Appropriate  Insight:  Appropriate  Engagement in Group:  Engaged  Modes of Intervention:  Problem-solving  Summary of Progress/Problems: Pt attended Psychoeducational group with top topic love languages.   Jacquelyne BalintForrest, Gearld Kerstein Shanta 11/20/2017, 2:56 PM

## 2017-11-20 NOTE — Progress Notes (Signed)
Pt presents with an animated affect and anxious mood. Pt noted to be fidgety, impulsive and easily agitated. Pt observed singing loudly in the hallway. Pt fixated on discharging today and was persistent about changing his IVC status to Vol. Per Dr. Jama Flavorsobos, pt was able to sign vol. Pt then requested to sign a 72 hour request for discharge form. Medications reviewed with pt. Pt provided with handouts about Ativan, Invega and Depakote at his request.  Verbal support provided. Pt encouraged to attend groups. 15 minute checks performed for safety.

## 2017-11-20 NOTE — [facility] Group Notes (Signed)
[facility]  Date/Time:  11/20/2017  11:00AM-12:00PM  Type of Therapy and Topic:  Group Therapy:  Music and Mood  Participation Level:  Active   Description of Group: In this process group, members listened to a variety of genres of music and identified that different types of music evoke different responses.  Patients were encouraged to identify music that was soothing for them and music that was energizing for them.  Patients discussed how this knowledge can help with wellness and recovery in various ways including managing depression and anxiety as well as encouraging healthy sleep habits.    Therapeutic Goals: 1. Patients will explore the impact of different varieties of music on mood 2. Patients will verbalize the thoughts they have when listening to different types of music 3. Patients will identify music that is soothing to them as well as music that is energizing to them 4. Patients will discuss how to use this knowledge to assist in maintaining wellness and recovery 5. Patients will explore the use of music as a coping skill  Summary of Patient Progress:  At the beginning of group, patient expressed that he felt well and at the end of group said he felt "well."  He was in and out of the room according to whether he liked the song.  He was labile; while irritable, his responses were all negative and when calm, his responses were all positive.  Therapeutic Modalities: Solution Focused Brief Therapy Activity   Ambrose MantleMareida Grossman-Orr, LCSW

## 2017-11-21 DIAGNOSIS — Z79899 Other long term (current) drug therapy: Secondary | ICD-10-CM

## 2017-11-21 MED ORDER — TRAZODONE HCL 50 MG PO TABS
50.0000 mg | ORAL_TABLET | Freq: Every evening | ORAL | Status: DC | PRN
  Administered 2017-11-21 – 2017-11-22 (×2): 50 mg via ORAL
  Filled 2017-11-21 (×5): qty 1

## 2017-11-21 MED ORDER — HYDROXYZINE HCL 50 MG PO TABS: 50.0000 mg | ORAL_TABLET | Freq: Four times a day (QID) | ORAL | Status: DC | PRN

## 2017-11-21 MED ORDER — PALIPERIDONE PALMITATE 234 MG/1.5ML IM SUSP
234.0000 mg | Freq: Once | INTRAMUSCULAR | Status: AC
  Administered 2017-11-21: 234 mg via INTRAMUSCULAR
  Filled 2017-11-21: qty 1.5

## 2017-11-21 MED ORDER — PALIPERIDONE PALMITATE 156 MG/ML IM SUSP: 156.0000 mg | Freq: Once | INTRAMUSCULAR | Status: DC

## 2017-11-21 NOTE — [facility] Group Notes (Signed)
LCSW Group Therapy Note   11/21/2017 1:15pm   Type of Therapy and Topic:  Group Therapy:  Overcoming Obstacles   Participation Level:  None   Description of Group:    In this group patients will be encouraged to explore what they see as obstacles to their own wellness and recovery. They will be guided to discuss their thoughts, feelings, and behaviors related to these obstacles. The group will process together ways to cope with barriers, with attention given to specific choices patients can make. Each patient will be challenged to identify changes they are motivated to make in order to overcome their obstacles. This group will be process-oriented, with patients participating in exploration of their own experiences as well as giving and receiving support and challenge from other group members.   Therapeutic Goals: 1. Patient will identify personal and current obstacles as they relate to admission. 2. Patient will identify barriers that currently interfere with their wellness or overcoming obstacles.  3. Patient will identify feelings, thought process and behaviors related to these barriers. 4. Patient will identify two changes they are willing to make to overcome these obstacles:      Summary of Patient Progress   Came initially.  Left after 8 minutes and did not return.   Therapeutic Modalities:   Cognitive Behavioral Therapy Solution Focused Therapy Motivational Interviewing Relapse Prevention Therapy  Ida RogueRodney B Darold Miley, LCSW 11/21/2017 2:07 PM

## 2017-11-21 NOTE — Progress Notes (Signed)
Psychoeducational Group Note  Date:  11/21/2017 Time:  0923  Group Topic/Focus:  Goals Group:   The focus of this group is to help patients establish daily goals to achieve during treatment and discuss how the patient can incorporate goal setting into their daily lives to aide in recovery.  Participation Level: Did Not Attend  Participation Quality:  Not Applicable  Affect:  Not Applicable  Cognitive:  Not Applicable  Insight:  Not Applicable  Engagement in Group: Not Applicable  Additional Comments:  Pt fell asleep after being medicated this morning and was unable to attend group.  Drayce Tawil E 11/21/2017, 9:24 AM

## 2017-11-21 NOTE — Progress Notes (Signed)
Recreation Therapy Notes  INPATIENT RECREATION THERAPY ASSESSMENT  Patient Details Name: Berta MinorJohn C Rottmann MRN: [id number] DOB: 1995-10-14 Today's Date: 11/21/2017       Information Obtained From: Patient  Able to Participate in Assessment/Interview: Yes  Patient Presentation: Responsive  Reason for Admission (Per Patient): Patient reports being admitted into the hospital because of manic episodes and insomnia. Patient also reports being admitted to get his head together and prescribed the right medications   Stressors: Patient did not identify any stressors   Coping Skills:   Isolation, Journal, Write, TV, Music, Art  Leisure Interests (2+):  Social - Friends  Frequency of Recreation/Participation: Data processing managerWeekly  Awareness of Community Resources:  Yes  Community Resources:  Tree surgeonMall, Research scientist (physical sciences)Movie Theaters, Public affairs consultantestaurants  Current Use: Yes  Expressed Interest in State Street CorporationCommunity Resource Information: No  Enbridge EnergyCounty of Residence:  [place] technical [place]   Patient Main Form of Transportation: Set designerCar  Patient Strengths:  Writing, Engineer, drillingpictures, academics   Patient Identified Areas of Improvement:  Coping skills  Patient Goal for Hospitalization:  To improve coping skills   Current SI (including self-harm):  No  Current HI:  No  Current AVH: No  Staff Intervention Plan: Group Attendance, Collaborate with Interdisciplinary Treatment Team  Consent to Intern Participation: Yes  Sheryle Hailarian Aslynn Brunetti, Recreation Therapy Intern   Sheryle HailDarian Lebert Lovern 11/21/2017, 2:02 PM

## 2017-11-21 NOTE — Progress Notes (Signed)
The patient expressed in group that he had a pretty good day overall and that he enjoyed meeting with his visitors. He also mentioned that he enjoyed the group that focused on "obstacles". His goal for tomorrow is to "keep getting better".

## 2017-11-21 NOTE — Progress Notes (Signed)
[facility]  11/21/2017 12:27 PM Kyle Kyle Mcmahon C Kyle Mcmahon  MRN:  [id number] Subjective:    Tacy DuraJohn Kyle Mcmahon is a 22 y/o M with history of Bipolar I who was admitted on IVC (later signed in voluntarily) with worsening symptoms of mania and psychosis. He was started on oral form of Invega and depakote was added to his regimen yesterday. He has been reporting improvement of his presenting symptoms.  Today upon evaluation, pt shares, "When I came in I was having insomnia; it was a manic episode - just coming up with so many ideas. I was making connections between so many things. I was drawing on the walls, but I'm feeling better now. The medications are helping." Pt denies SI/HI/AH/VH. He notes that symptoms of flight of ideas and distractibility has been improving. He is sleeping well. He denies any physical complaints. He is tolerating his current medications without difficulty or side effects. Discussed with Kyle Kyle Mcmahon about treatment options, and, in the context of previous poor adherence to his outpatient regimen, offered to start Kyle Kyle Mcmahon on long-acting injectable form of [place] Sustenna. Pt was in agreement with this plan. He would like to stay on depakote at this time. He was in agreement to discontinue ativan and replace it with atarax for as needed treatment of anxiety. Pt was in agreement with the above plan and he had no further questions, comments, or concerns.  Principal Problem: Bipolar I disorder, most recent episode (or current) manic (HCC) Diagnosis:   Kyle Kyle Mcmahon Active Problem List   Diagnosis Date Noted  . Bipolar I disorder, most recent episode (or current) manic (HCC) [F31.10] 11/18/2017  . 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 Behavior change due to substance use [R46.89] 07/11/2017  . Abscess [L02.91] 02/17/2017  . Preventative health care [Z00.00] 08/05/2014   Total Time spent with Kyle Kyle Mcmahon: 30 minutes  Past Psychiatric History: see H&P  Past Medical  History:  Past Medical History:  Diagnosis Date  . Depression     Past Surgical History:  Procedure Laterality Date  . HERNIA REPAIR     baby  . 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 Bipolar disorder Mother    Family Psychiatric  History: see H&P Social History:  Social History   Substance and Sexual Activity  Alcohol Use Yes   Comment: 3-4 drinks 2-4 x per month     Social History   Substance and Sexual Activity  Drug Use Yes   Comment: Pt endorsed Marijuana use; UDS was clear    Social History   Socioeconomic History  . Marital status: Single    Spouse name: None  . Number of children: None  . Years of education: None  . Highest education level: None  Social Needs  . Financial resource strain: None  . Food insecurity - worry: None  . Food insecurity - inability: None  . Transportation needs - medical: None  . Transportation needs - non-medical: None  Occupational History  . None  Tobacco Use  . Smoking status: Never Smoker  . Smokeless tobacco: Never Used  Substance and Sexual Activity  . Alcohol use: Yes    Comment: 3-4 drinks 2-4 x per month  . Drug use: Yes    Comment: Pt endorsed Marijuana use; UDS was clear  . Sexual activity: Not Currently  Other Topics Concern  . None  Social History Narrative   Lives  with mother sister and maternal aunt.   Additional Social History:                         Sleep: Fair  Appetite:  Fair  Current Medications: Current Facility-Administered Medications  Medication Dose Route Frequency Provider Last Rate Last Dose  . acetaminophen (TYLENOL) tablet 650 mg  650 mg Oral Q6H PRN Charm Rings, NP      . alum & mag hydroxide-simeth (MAALOX/MYLANTA) 200-200-20 MG/5ML suspension 30 mL  30 mL Oral Q4H PRN Charm Rings, NP      . divalproex (DEPAKOTE ER) 24 hr tablet 500 mg  500 mg Oral Daily Cobos, Rockey Situ, MD   500 mg at  11/21/17 0750  . hydrOXYzine (ATARAX/VISTARIL) tablet 50 mg  50 mg Oral Q6H PRN Micheal Likens, MD      . magnesium hydroxide (MILK OF MAGNESIA) suspension 30 mL  30 mL Oral Daily PRN Charm Rings, NP   30 mL at 11/19/17 0615  . paliperidone (INVEGA SUSTENNA) injection 234 mg  234 mg Intramuscular Once Micheal Likens, MD       Followed by  . [START ON 11/28/2017] paliperidone (INVEGA SUSTENNA) injection 156 mg  156 mg Intramuscular Once Jolyne Loa T, MD      . paliperidone (INVEGA) 24 hr tablet 6 mg  6 mg Oral QHS Cobos, Rockey Situ, MD   6 mg at 11/20/17 2153    Lab Results: No results found for this or any previous visit (from the past 48 hour(s)).  Blood Alcohol level:  Lab Results  Component Value Date   ETH <10 11/16/2017   ETH <10 07/10/2017    Metabolic Disorder Labs: Lab Results  Component Value Date   HGBA1C 5.7 (H) 11/19/2017   MPG 117 11/19/2017   Lab Results  Component Value Date   PROLACTIN 43.9 (H) 11/18/2017   Lab Results  Component Value Date   CHOL 161 11/19/2017   TRIG 59 11/19/2017   HDL 37 (L) 11/19/2017   CHOLHDL 4.4 11/19/2017   VLDL 12 11/19/2017   LDLCALC 112 (H) 11/19/2017    Physical Findings: AIMS: Facial and Oral Movements Muscles of Facial Expression: None, normal Lips and Perioral Area: None, normal Jaw: None, normal Tongue: None, normal,Extremity Movements Upper (arms, wrists, hands, fingers): None, normal Lower (legs, knees, ankles, toes): None, normal, Trunk Movements Neck, shoulders, hips: None, normal, Overall Severity Severity of abnormal movements (highest score from questions above): None, normal Incapacitation due to abnormal movements: None, normal Kyle Kyle Mcmahon's awareness of abnormal movements (rate only Kyle Kyle Mcmahon's report): No Awareness, Dental Status Current problems with teeth and/or dentures?: No Does Kyle Kyle Mcmahon usually wear dentures?: No  CIWA:    COWS:     Musculoskeletal: Strength & Muscle  Tone: within normal limits Gait & Station: normal Kyle Kyle Mcmahon leans: N/A  Psychiatric Specialty Exam: Physical Exam  Nursing note and vitals reviewed.   Review of Systems  Constitutional: Negative for chills and fever.  Respiratory: Negative for cough and shortness of breath.   Cardiovascular: Negative for chest pain.  Gastrointestinal: Negative for abdominal pain, heartburn, nausea and vomiting.  Psychiatric/Behavioral: Negative for depression, hallucinations and suicidal ideas. The Kyle Kyle Mcmahon is not nervous/anxious and does not have insomnia.     Blood pressure 128/61, pulse (!) 115, temperature 97.9 F (36.6 C), temperature source Oral, resp. rate 18, height 5\' 10"  (1.778 m), weight 105.7 kg (233 lb), SpO2 100 %.Body mass index is 33.43 kg/m.  General Appearance: Casual and Fairly Groomed  Eye Contact:  Good  Speech:  Clear and Coherent and Normal Rate  Volume:  Normal  Mood:  Anxious  Affect:  Appropriate, Blunt and Congruent  Thought Process:  Coherent and Goal Directed  Orientation:  Full (Time, Place, and Person)  Thought Content:  Logical  Suicidal Thoughts:  No  Homicidal Thoughts:  No  Memory:  Immediate;   Fair Recent;   Fair Remote;   Fair  Judgement:  Fair  Insight:  Fair  Psychomotor Activity:  Normal  Concentration:  Concentration: Fair  Recall:  [employer] of Knowledge:  Fair  Language:  Fair  Akathisia:  No  Handed:    AIMS (if indicated):     Assets:  Communication Skills Resilience Social Support  ADL's:  Intact  Cognition:  WNL  Sleep:  Number of Hours: 6.25     Treatment Plan Summary: Daily contact with Kyle Kyle Mcmahon to assess and evaluate symptoms and progress in treatment and Medication management   -Continue inpatient hospitalization  - Bipolar I, current episode manic   - Continue Invega 6mg  po qhs   - Start [place] 234mg  IM once (today) and then [place] 156mg  IM q28 days starting on 11/28/17 +/- 4 days.    - Continue Depakote ER  500mg  po qhs  - Anxiety   - DC ativan  - Start atarax 50mg  po q6h prn anxiety/insomnia  -Encourage participation in groups and therapeutic milieu  -Disposition planning will be ongoing  Micheal Likens, MD 11/21/2017, 12:27 PM

## 2017-11-21 NOTE — Progress Notes (Signed)
Nursing Progress Note: 7p-7a D: Pt currently presents with a animated/pleasant/anxious affect and behavior. Pt states "Obstacles group really was helpful to me today. Tomorrow's goal is to improve my attitude towards my treatment and learn things that enlighten me." Interacting apprporiately with the milieu. Pt reports good sleep during the previous night with current medication regimen. Pt did attend wrap-up group.  A: Pt provided with medications per providers orders. Pt's labs and vitals were monitored throughout the night. Pt supported emotionally and encouraged to express concerns and questions. Pt educated on medications.  R: Pt's safety ensured with 15 minute and environmental checks. Pt currently denies SI, HI, and AVH. Pt verbally contracts to seek staff if SI,HI, or AVH occurs and to consult with staff before acting on any harmful thoughts. Will continue to monitor.

## 2017-11-21 NOTE — Progress Notes (Signed)
Patient ID: Kyle MinorJohn C Mcmahon, male   DOB: 10/08/1995, 22 y.o.   MRN: [id number] D: Pt denies SI/HI/AVH, appeared drowsy and verbalized being tired earlier in shift, but appeared to be more alert as the afternoon approached.  Pt's mother called earlier in shift stating that she wanted to have a family session to  discuss pt's treatment, and pt's social worker (Rod) was made aware.     A:   Pt reports his sleep quality last nighti as "good", reports his appetite as "good", describes his energy level as normal and describes his concentration level as good.  Pt rates his depression today as "0" (10 being the worst), describes his hopelessness level as "0" (10 being the worst. And rates his anxiety level as "0" (10 being the worst.  Pt reports his goal for today as "talking to the doctor and taking my meds", and states will "to to group, talk with the doctor" to meet his goal.    R: Pt maintained on Q15 minute checks, given all meds as scheduled, denies having any current concerns.  Will continue to monitor.

## 2017-11-21 NOTE — Progress Notes (Addendum)
Recreation Therapy Notes  Date: 3.11.19 Time: 10:00 am Location: [address] Dayroom   Group Topic: Coping Skills   Goal Area(s) Addresses:  Goal 1.1: To improve coping skills  . Patient will Identify what a coping skills is  . Patient will identify personal coping skills  . Patient will be able to identify how coping skills can improve their wellness  Behavioral Response: Patient did not attend   Sheryle Hailarian [place], Recreation Therapy Intern  Sheryle HailDarian Rihan Schueler 11/21/2017 11:22 AM

## 2017-11-22 MED ORDER — PALIPERIDONE PALMITATE 156 MG/ML IM SUSP: 156.0000 mg | INTRAMUSCULAR | Status: DC

## 2017-11-22 MED ORDER — DIVALPROEX SODIUM ER 500 MG PO TB24: 500.0000 mg | ORAL_TABLET | Freq: Every day | ORAL | 0 refills | Status: DC

## 2017-11-22 MED ORDER — PALIPERIDONE PALMITATE 156 MG/ML IM SUSP: 156.0000 mg | INTRAMUSCULAR | 0 refills | Status: DC

## 2017-11-22 MED ORDER — TRAZODONE HCL 50 MG PO TABS: ORAL_TABLET | ORAL | 0 refills | Status: DC

## 2017-11-22 MED ORDER — PALIPERIDONE PALMITATE 156 MG/ML IM SUSP: 156.0000 mg | Freq: Once | INTRAMUSCULAR | 0 refills | Status: DC

## 2017-11-22 MED ORDER — PALIPERIDONE ER 6 MG PO TB24: 6.0000 mg | ORAL_TABLET | Freq: Every day | ORAL | 0 refills | Status: DC

## 2017-11-22 MED ORDER — HYDROXYZINE HCL 50 MG PO TABS: 50.0000 mg | ORAL_TABLET | Freq: Four times a day (QID) | ORAL | 0 refills | Status: DC | PRN

## 2017-11-22 NOTE — Progress Notes (Signed)
D: Pt A & O X 3. Denies SI, HI, AVH and pain when assessed. Presents animated, observed singing loudly in dayroom at the beginning of this shift. Visible in milieu at intervals.  Minimal but appropriate interactions observed with peers and staff. Pt d/c home as ordered. Picked up in the lobby by his mother.  A: Scheduled medication administered as ordered with verbal education and effects monitored. D/C instructions reviewed with pt and mother including prescriptions (dosing instructions of Luvenia Starchnvega Sustena) and follow up appointment; compliance encouraged. All belongings in locker 28 returned to pt at time of departure. Q 15 minutes safety checks maintained till time of d/cwithout self harm gestures or outburst. R: Pt receptive to care. Compliant with medications, denied adverse drug reactions when assessed. Pt and mother verbalized understanding related to d/c instructions (dosing instructions of Luvenia Starchnvega Sustena). Pt signed belonging sheet in agreement with items received. Vitals stable, appears to be in no physical distress at time of d/c from facility.

## 2017-11-22 NOTE — [facility] Suicide Risk Assessment (Signed)
[facility]   Principal Problem: Bipolar I disorder, most recent episode (or current) manic [facility](HCC) Discharge Diagnoses:  Patient Active Problem List   Diagnosis Date Noted  . Bipolar I disorder, most recent episode (or current) manic (HCC) [F31.10] 11/18/2017  . 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 Behavior change due to substance use [R46.89] 07/11/2017  . Abscess [L02.91] 02/17/2017  . Preventative health care [Z00.00] 08/05/2014    Total Time spent with patient: 30 minutes  Musculoskeletal: Strength & Muscle Tone: within normal limits Gait & Station: normal Patient leans: N/A  Psychiatric Specialty Exam: Review of Systems  Constitutional: Negative for chills and fever.  Respiratory: Negative for cough and shortness of breath.   Cardiovascular: Negative for chest pain.  Gastrointestinal: Negative for abdominal pain, heartburn, nausea and vomiting.  Genitourinary: Negative for hematuria.  Psychiatric/Behavioral: Negative for depression, hallucinations and suicidal ideas. The patient is not nervous/anxious.     Blood pressure 126/70, pulse (!) 126, temperature 97.6 F (36.4 C), temperature source Oral, resp. rate 20, height 5\' 10"  (1.778 m), weight 105.7 kg (233 lb), SpO2 100 %.Body mass index is 33.43 kg/m.  General Appearance: Casual and Fairly Groomed  Patent attorneyye Contact::  Good  Speech:  Clear and Coherent and Normal Rate  Volume:  Normal  Mood:  Euthymic  Affect:  Appropriate and Congruent  Thought Process:  Coherent and Goal Directed  Orientation:  Full (Time, Place, and Person)  Thought Content:  Logical  Suicidal Thoughts:  No  Homicidal Thoughts:  No  Memory:  Immediate;   Fair Recent;   Fair Remote;   Fair  Judgement:  Fair  Insight:  Fair  Psychomotor Activity:  Normal  Concentration:  Fair  Recall:  [employer]Fair  Fund of Knowledge:Fair  Language: Fair  Akathisia:  No  Handed:    AIMS (if indicated):      Assets:  Communication Skills Desire for Improvement Financial Resources/Insurance Housing Leisure Time Physical Health Resilience Social Support  Sleep:  Number of Hours: 6.5  Cognition: WNL  ADL's:  Intact   Mental Status Per Nursing Assessment::   On Admission:  NA  Demographic Factors:  Male, Adolescent or young adult and Unemployed  Loss Factors: NA  Historical Factors: Family history of mental illness or substance abuse and Impulsivity  Risk Reduction Factors:   Sense of responsibility to family, Positive social support, Positive therapeutic relationship and Positive coping skills or problem solving skills  Continued Clinical Symptoms:  Severe Anxiety and/or Agitation Bipolar Disorder:   Mixed State Alcohol/Substance Abuse/Dependencies  Cognitive Features That Contribute To Risk:  None    Suicide Risk:  Minimal: No identifiable suicidal ideation.  Patients presenting with no risk factors but with morbid ruminations; may be classified as minimal risk based on the severity of the depressive symptoms  Follow-up Information    [facility] [place] [facility] up on 12/02/2017.   Why:  Medication management appt with Raiford Nobleodd Colucci NP on 3/22 at 1:00PM. PLEASE WALK IN FOR TRIAGE THERAPY APPT MON-FRI BETWEEN 9AM-12PM OR 1PM-4PM AND TO BE SCHEDULED WITH KELSEY. Please walk in when you return to school March 18th.  Contact information: [facility] [address] [place], [place] [address] Phone: [phone] Fax: [phone]         Subjective Data:  Kyle Mcmahon is a 22 y/o M with history of bipolar disorder who was admitted with worsening symptoms of mania and psychosis in the context of illicit  substance use of cannabis and poor medication adherence as an outpatient. He was started on [place] and transitioned to long-acting injectable form of [place]. He was also started on depakote. He reported gradual improvement of his  presenting symptoms.   Today upon evaluation, pt shares, "I'm feeling good - much better today." He denies any specific complaints today. He has no physical complaints. He denies SI/HI/AH/VH. In regards to his delusional thought patterns of connecting multiple unrelated ideas, pt shares, "It's mostly gone, I'm able to ignore it now." Pt reports he is tolerating his medications well. Collateral information from pt's mother is that she also has bipolar disorder and has attempted trial of depakote in the past, but she felt that she was unable to tolerate it. Discussed with patient about his mother's recommendation for trial of a different medication or previous medication of lamictal, and pt responded, "Well I've tried the lamictal before, so I think I want to try the depakote this time." Discussed with patient about importance of receiving booster injection of [place] for which he is due on 11/28/17 and pt verbalized plan to receive booster injection at his follow up appointment. He is in agreement to continue his current regimen without changes. He was able to engage in safety planning including plan to return to [facility] or contact emergency services if he feels unable to maintain his own safety or the safety of others. Pt had no further questions, comments, or concerns.   Plan Of Care/Follow-up recommendations:    -Discharge to outpatient level of care  - Bipolar I, current episode manic             - Continue Invega 6mg  po qhs             - Continue Invega Sustenna 156mg  IM q30 days starting on 11/28/17.              - Continue Depakote ER 500mg  po qhs  - Anxiety             - Start atarax 50mg  po q6h prn anxiety/insomnia  Activity:  as tolerated Diet:  normal Tests:  Depakote level as an outpatient Other:  see above for DC plan  Micheal Likens, MD 11/22/2017, 10:44 AM

## 2017-11-22 NOTE — Plan of Care (Signed)
3.12.19 Patient did not meet goal of identifying three positive coping skills due to discharging

## 2017-11-22 NOTE — Discharge Summary (Addendum)
Physician Discharge Summary Note  Patient:  Kyle Mcmahon is an 22 y.o., male  MRN:  [id number]  DOB:  July 29, 1996  Patient phone:  [phone] (home)   Patient address:   [address] [place] [address],   Total Time spent with patient: Greater than 30 minutes  Date of Admission:  11/17/2017 Date of Discharge: 11-22-17  Reason for Admission: Subtstance induced psychosis, mania & illicit drug use.  Principal Problem: Bipolar I disorder, most recent episode (or current) manic [facility](HCC)  Discharge Diagnoses: Patient Active Problem List   Diagnosis Date Noted  . Substance-induced psychotic disorder [facility](HCC) [F19.959] 07/12/2017    Priority: High  . Bipolar I disorder, most recent episode (or current) manic (HCC) [F31.10] 11/18/2017  . Behavior change due to substance use [R46.89] 07/11/2017  . Abscess [L02.91] 02/17/2017  . Preventative health care [Z00.00] 08/05/2014   Past Psychiatric History: Psychosis.  Past Medical History:  Past Medical History:  Diagnosis Date  . Depression     Past Surgical History:  Procedure Laterality Date  . HERNIA REPAIR     baby  . 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 Bipolar disorder Mother    Family Psychiatric  History: See H&P  Social History:  Social History   Substance and Sexual Activity  Alcohol Use Yes   Comment: 3-4 drinks 2-4 x per month     Social History   Substance and Sexual Activity  Drug Use Yes   Comment: Pt endorsed Marijuana use; UDS was clear    Social History   Socioeconomic History  . Marital status: Single    Spouse name: None  . Number of children: None  . Years of education: None  . Highest education level: None  Social Needs  . Financial resource strain: None  . Food insecurity - worry: None  . Food insecurity - inability: None  . Transportation needs - medical: None  . Transportation needs -  non-medical: None  Occupational History  . None  Tobacco Use  . Smoking status: Never Smoker  . Smokeless tobacco: Never Used  Substance and Sexual Activity  . Alcohol use: Yes    Comment: 3-4 drinks 2-4 x per month  . Drug use: Yes    Comment: Pt endorsed Marijuana use; UDS was clear  . Sexual activity: Not Currently  Other Topics Concern  . None  Social History Narrative   Lives with mother sister and maternal aunt.   Hospital Course: (Per md's SRA): Kyle Mcmahon is a 22 y/o M with history of bipolar disorder who was admitted with worsening symptoms of mania and psychosis in the context of illicit substance use of cannabis and poor medication adherence as an outpatient. He was started on [place]vega and transitioned to long-acting injectable form of [place]vega Sustenna. He was also started on depakote. He reported gradual improvement of his presenting symptoms.   After his admission assessment, Kyle Mcmahon's presenting symptoms were identified. The medication regimen targeting those symptoms were initiated. He was medicated & discharged on; Depakote ER 500 mg for mood stabilization, Hydroxyzine 50 mg prn for anxiety, Paliperidone 6 mg for mood control, Paliperidone injectable 156 mg/ml Q monthly for mood control & Trazodone 50 mg for insomnia. He presented no other pre-existing medical problems that needed treatment. Kyle Mcmahon was enrolled & participated in the group counseling sessions being offered & held on this unit. He  learned coping skills.  Today upon his discharge evaluation with the outpatient provider, pt shares, "I'm feeling good - much better today." He denies any specific complaints today. He has no physical complaints. He denies SI/HI/AH/VH. In regards to his delusional thought patterns of connecting multiple unrelated ideas, pt shares, "It's mostly gone, I'm able to ignore it now." Pt reports he is tolerating his medications well. Collateral information from pt's mother is that she also has bipolar  disorder and has attempted trial of depakote in the past, but she felt that she was unable to tolerate it. Discussed with patient about his mother's recommendation for trial of a different medication or previous medication of lamictal, and pt responded, "Well I've tried the lamictal before, so I think I want to try the depakote this time." Discussed with patient about importance of receiving booster injection of [place] for which he is due on 11/28/17 and pt verbalized plan to receive booster injection at his follow up appointment. He is in agreement to continue his current regimen without changes. He was able to engage in safety planning including plan to return to [facility] or contact emergency services if he feels unable to maintain his own safety or the safety of others. Pt had no further questions, comments, or concerns.  Upon discharge, Kyle Mcmahon was both mentally and medically stable. He denies suicidal/homicidal ideations, auditory/visual/tactile hallucinations, delusional thoughts or paranoia. He will continue mental health care on an outpatient basis as noted below. He was provided with all the necessary information needed to make this appointment without problems. He left [facility] with all belongings in no distress. Transportation per family.  Physical Findings: AIMS: Facial and Oral Movements Muscles of Facial Expression: None, normal Lips and Perioral Area: None, normal Jaw: None, normal Tongue: None, normal,Extremity Movements Upper (arms, wrists, hands, fingers): None, normal Lower (legs, knees, ankles, toes): None, normal, Trunk Movements Neck, shoulders, hips: None, normal, Overall Severity Severity of abnormal movements (highest score from questions above): None, normal Incapacitation due to abnormal movements: None, normal Patient's awareness of abnormal movements (rate only patient's report): No Awareness, Dental Status Current problems with teeth and/or dentures?: No Does patient  usually wear dentures?: No  CIWA:    COWS:     Musculoskeletal: Strength & Muscle Tone: within normal limits Gait & Station: normal Patient leans: N/A  Psychiatric Specialty Exam: Physical Exam  Constitutional: He appears well-developed.  HENT:  Head: Normocephalic.  Eyes: Pupils are equal, round, and reactive to light.  Neck: Normal range of motion.  Cardiovascular: Normal rate.  Respiratory: Effort normal.  GI: Soft.  Genitourinary:  Genitourinary Comments: Deferred  Musculoskeletal: Normal range of motion.  Neurological: He is alert.  Skin: Skin is warm.    ROS  Blood pressure 126/70, pulse (!) 126, temperature 97.6 F (36.4 C), temperature source Oral, resp. rate 20, height 5\' 10"  (1.778 m), weight 105.7 kg (233 lb), SpO2 100 %.Body mass index is 33.43 kg/m.  See Md's SRA   Have you used any form of tobacco in the last 30 days? (Cigarettes, Smokeless Tobacco, Cigars, and/or Pipes): No  Has this patient used any form of tobacco in the last 30 days? (Cigarettes, Smokeless Tobacco, Cigars, and/or Pipes): N/A  Blood Alcohol level:  Lab Results  Component Value Date   ETH <10 11/16/2017   ETH <10 07/10/2017   Metabolic Disorder Labs:  Lab Results  Component Value Date   HGBA1C 5.7 (H) 11/19/2017   MPG 117 11/19/2017   Lab Results  Component Value Date   PROLACTIN 43.9 (H) 11/18/2017   Lab Results  Component Value Date   CHOL 161 11/19/2017   TRIG 59 11/19/2017   HDL 37 (L) 11/19/2017   CHOLHDL 4.4 11/19/2017   VLDL 12 11/19/2017   LDLCALC 112 (H) 11/19/2017   See Psychiatric Specialty Exam and Suicide Risk Assessment completed by Attending Physician prior to discharge.  Discharge destination:  Home  Is patient on multiple antipsychotic therapies at discharge:  No   Has Patient had three or more failed trials of antipsychotic monotherapy by history:  No  Recommended Plan for Multiple Antipsychotic Therapies: NA  Allergies as of 11/22/2017   No Known  Allergies     Medication List    STOP taking these medications   risperiDONE 1 MG tablet Commonly known as:  RISPERDAL     TAKE these medications     Indication  divalproex 500 MG 24 hr tablet Commonly known as:  DEPAKOTE ER Take 1 tablet (500 mg total) by mouth daily. For mood stabilization Start taking on:  11/23/2017  Indication:  Mood stabilization   hydrOXYzine 50 MG tablet Commonly known as:  ATARAX/VISTARIL Take 1 tablet (50 mg total) by mouth every 6 (six) hours as needed for anxiety. What changed:    medication strength  how much to take  when to take this  Indication:  Feeling Anxious   paliperidone 156 MG/ML Susp injection Commonly known as:  INVEGA SUSTENNA Inject 1 mL (156 mg total) into the muscle once for 1 dose. (Due 11-28-17): For mood control Start taking on:  11/28/2017  Indication:  Mood control   paliperidone 156 MG/ML Susp injection Commonly known as:  INVEGA SUSTENNA Inject 1 mL (156 mg total) into the muscle every 30 (thirty) days. (Due on 12-29-17): For mood control Start taking on:  12/29/2017  Indication:  Mood control   paliperidone 6 MG 24 hr tablet Commonly known as:  INVEGA Take 1 tablet (6 mg total) by mouth at bedtime. For mood control  Indication:  Mood control   traZODone 50 MG tablet Commonly known as:  DESYREL Take 1 tablet (50 mg) by mouth at bedtime: For sleep What changed:    how much to take  how to take this  when to take this  reasons to take this  additional instructions  Indication:  Trouble Sleeping      Follow-up Information    [facility]seling & [facility] at [facility] ([facility]) Follow up on 12/02/2017.   Why:  Medication management appt with Raiford Noble NP on 3/22 at 1:00PM. PLEASE WALK IN FOR TRIAGE THERAPY APPT MON-FRI BETWEEN 9AM-12PM OR 1PM-4PM AND TO BE SCHEDULED WITH KELSEY. Please walk in when you return to school March 18th.  Contact information: [address] [place] [address] Phone: [phone] Fax: [phone]          Follow-up recommendations: Activity:  As tolerated Diet: As recommended by your primary care doctor. Keep all scheduled follow-up appointments as recommended.   Comments: Patient is instructed prior to discharge to: Take all medications as prescribed by his/her mental healthcare provider. Report any adverse effects and or reactions from the medicines to his/her outpatient provider promptly. Patient has been instructed & cautioned: To not engage in alcohol and or illegal drug use while on prescription medicines. In the event of worsening symptoms, patient is instructed to call the crisis hotline, 911 and or go to the nearest ED for appropriate evaluation and  treatment of symptoms. To follow-up with his/her primary care provider for your other medical issues, concerns and or health care needs.   Signed: Armandina Stammer, NP, PMHNP, FNP-BC 11/22/2017, 11:00 AM   Patient seen, Suicide Assessment Completed.  Disposition Plan Reviewed

## 2017-11-22 NOTE — Progress Notes (Signed)
  [facility] Discharge Plan :  Will you be returning to the same living situation after discharge:  Yes,  home At discharge, do you have transportation home?: Yes,  mother Do you have the ability to pay for your medications: Yes,  insurance  Release of information consent forms completed and in the chart;  Patient's signature needed at discharge.  Patient to Follow up at: Follow-up Information    [facility] [place] ([facility] Follow up on 12/02/2017.   Why:  Medication management appt with Raiford Nobleodd Colucci NP on 3/22 at 1:00PM. PLEASE WALK IN FOR TRIAGE THERAPY APPT MON-FRI BETWEEN 9AM-12PM OR 1PM-4PM AND TO BE SCHEDULED WITH KELSEY. Please walk in when you return to school March 18th.  Contact information: [address] [place], [place] [address] Phone: [phone] Fax: [phone]           Next level of care provider has access to [facility] Link:no  Safety Planning and Suicide Prevention discussed: Yes,  yes  Have you used any form of tobacco in the last 30 days? (Cigarettes, Smokeless Tobacco, Cigars, and/or Pipes): No  Has patient been referred to the Quitline?: N/A patient is not a smoker  Patient has been referred for addiction treatment: N/A  Ida RogueRodney B Shoshannah Faubert, LCSW 11/22/2017, 9:46 AM

## 2017-11-22 NOTE — Progress Notes (Addendum)
Recreation Therapy Notes  Date: 3.12.19 Time: 10:00 a.m. Location: [address] Dayroom   Group Topic: Self-Esteem   Goal Area(s) Addresses:  Goal 1.1: To increase self-esteem  - Group will improve mood through participation during Recreation Therapy tx.  - Group will identify at least two positive traits by the end of therapy session.   - Group will identify the importance of self-esteem  Behavioral Response: Appropriate   Intervention:Craft/Art    Activity: Positive Affirmation Banner: Patients had the opportunity to color in a positive affirmation banner of their choice using the color pencils and markers provided.    Education: Self-Esteem   Education Outcome: Acknowledges Education  Clinical Observations/Feedback: Patient was engaged during group activity appropriately participating during Recreation Therapy group tx. Patient was able to identify two positive traits stating "skin and brain". Patient was able to identify the importance of self-esteem. Patient actively participated during introduction and closing discussion. Patient successfully met Goal 1.1 (see above).   Ranell Patrick, Recreation Therapy Intern   Ranell Patrick 11/22/2017 11:09 AM

## 2017-11-22 NOTE — Progress Notes (Signed)
Recreation Therapy Notes  INPATIENT RECREATION TR PLAN  Patient Details Name: Kyle Mcmahon MRN: [id number] DOB: 10/27/1995 Today's Date: 11/22/2017  Rec Therapy Plan Is patient appropriate for Therapeutic Recreation?: Yes Treatment times per week: At least three  Estimated Length of Stay: 5-7 days  TR Treatment/Interventions: Group participation (Approrpiate participation during Recreation Therapy group tx.)  Discharge Criteria Pt will be discharged from therapy if:: Discharged Treatment plan/goals/alternatives discussed and agreed upon by:: Patient/family  Discharge Summary Short term goals set: See Care Plan  Short term goals met: Not met Reason goals not met: Patient discharged  Reason patient discharged from therapy: Discharge from hospital Pt/family agrees with progress & goals achieved: Yes Date patient discharged from therapy: 11/22/17   , Recreation Therapy Intern     11/22/2017, 2:10 PM  

## 2017-11-25 ENCOUNTER — Ambulatory Visit (INDEPENDENT_AMBULATORY_CARE_PROVIDER_SITE_OTHER): Payer: BC Managed Care – PPO | Admitting: Family Medicine

## 2017-11-25 ENCOUNTER — Encounter: Payer: Self-pay | Admitting: Family Medicine

## 2017-11-25 ENCOUNTER — Other Ambulatory Visit: Payer: Self-pay

## 2017-11-25 VITALS — BP 122/64 | HR 96 | Temp 98.0°F | Ht 72.0 in | Wt 244.2 lb

## 2017-11-25 DIAGNOSIS — M62838 Other muscle spasm: Secondary | ICD-10-CM | POA: Diagnosis not present

## 2017-11-25 DIAGNOSIS — J302 Other seasonal allergic rhinitis: Secondary | ICD-10-CM | POA: Diagnosis not present

## 2017-11-25 DIAGNOSIS — Z23 Encounter for immunization: Secondary | ICD-10-CM | POA: Diagnosis not present

## 2017-11-25 DIAGNOSIS — Z5181 Encounter for therapeutic drug level monitoring: Secondary | ICD-10-CM | POA: Diagnosis not present

## 2017-11-25 DIAGNOSIS — F309 Manic episode, unspecified: Secondary | ICD-10-CM | POA: Diagnosis not present

## 2017-11-25 NOTE — Progress Notes (Signed)
Subjective:    Kyle Mcmahon is a 22 y.o. male who presents to [facility] today for hospital FU:  1.  Hospital FU:  Patient hospitalized this past week for manic episode.  See notes for full details.  Discharged this week on Invega IM plus pills only for 15 days.  Also on Depakote, hydroxyzine.  Plan is to FU with psychiatrist at [facility] when he returns to school Monday and reportedly have an [place] booster.  Mood much better controlled.  He is doing well by both his report and mother's report, who is with him today.    2.  Neck spasm: Has had left sided neck spasm/cramp as well as Right sided thoracic back spasm since starting Invega.  Did not occur while hospitalized.  Had never had spasms before.  No HAs.  No incontinence.  No fall or injury.  Has tried heating pad with some relief, lidocaine patch and OTC analgesics -- latter two without much relief.  Just started cogentin, which is to prevent spasms and EPS from [place].  No blurry vision.  No fevers/chills.   3.  Nasal congestion:  Worse for past week.  Used to be on cetirizine for allergies, not taking currently.  No other breathing issues.  Occasional nose itching.  No eye itching or redness.  Hasn't tried anything else for relief.    ROS as above per HPI.   The following portions of the patient's history were reviewed and updated as appropriate: allergies, current medications, past medical history, family and social history, and problem list. Patient is a nonsmoker.    PMH reviewed.  Past Medical History:  Diagnosis Date  . Depression    Past Surgical History:  Procedure Laterality Date  . HERNIA REPAIR     baby  . 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 Current Outpatient Medications  Medication Sig Dispense Refill  . divalproex (DEPAKOTE ER) 500 MG 24 hr tablet Take 1 tablet (500 mg total) by mouth daily. For mood stabilization 30 tablet 0  . hydrOXYzine (ATARAX/VISTARIL) 50 MG tablet Take 1 tablet (50  mg total) by mouth every 6 (six) hours as needed for anxiety. 60 tablet 0  . [START ON 11/28/2017] paliperidone (INVEGA SUSTENNA) 156 MG/ML SUSP injection Inject 1 mL (156 mg total) into the muscle once for 1 dose. (Due 11-28-17): For mood control 1 mL 0  . [START ON 12/29/2017] paliperidone (INVEGA SUSTENNA) 156 MG/ML SUSP injection Inject 1 mL (156 mg total) into the muscle every 30 (thirty) days. (Due on 12-29-17): For mood control 0.9 mL 0  . paliperidone (INVEGA) 6 MG 24 hr tablet Take 1 tablet (6 mg total) by mouth at bedtime. For mood control 15 tablet 0  . traZODone (DESYREL) 50 MG tablet Take 1 tablet (50 mg) by mouth at bedtime: For sleep 30 tablet 0   No current facility-administered medications for this visit.      Objective:   Physical Exam BP 122/64   Pulse 96   Temp 98 F (36.7 C) (Oral)   Ht 6' (1.829 m)   Wt 244 lb 3.2 oz (110.8 kg)   SpO2 97%   BMI 33.12 kg/m  Gen:  Alert, cooperative patient who appears stated age in no acute distress.  Vital signs reviewed.  Looks very well.   HEENT: EOMI,  MMM.  Nasal turbinates enlarged BL Neck:  Full ROM in all fields.  Does have some mild tenderness Left  side of neck when he turns head fully to Right.  TTP along left trapezius.  No midline tenderness. Back:  Mild Right thoracic tenderness with spasm noted.  Rest of back normal with good ROM throughout.  Cardiac:  Regular rate and rhythm without murmur auscultated.  Good S1/S2. Pulm:  Clear to auscultation bilaterally with good air movement.  No wheezes or rales noted.   Psych:  Not depressed or anxious appearing.  Linear and coherent thought process as evidenced by speech pattern. Smiles spontaneously.   Imp/Plan: 1. Back spasm: - likely secondary to Invega.  Better today - see AVS -- plan to see how much cogentin continues to decrease this pain - can do short-term low dose Baclofen starting Monday if no better this weekend.  Kyle Mcmahon- Alleve for pain relief currently.  Continue  heat/sx treatment  2.  Bipolar disorder: - currently well controlled.  Mood good.  He feels much more stabilized.   - continue to follow with psych - mom and grandmother both with Bipolar, both tried on Depakote and lithium without much help.  She is asking he be switched to lamictal.  Defer to psych.   3.  Seasonal allergies: - saline to treat.  Flonase if no better.

## 2017-11-25 NOTE — Addendum Note (Signed)
Addended by: Georges LynchSAUNDERS, Nazaret Chea T on: 11/25/2017 03:50 PM   Modules accepted: Orders, SmartSet

## 2017-11-25 NOTE — Patient Instructions (Signed)
It was good to see you again today.  For your neck and back spasms, continue to use heat as you have been.  Start taking over-the-counter Aleve for pain relief as well.  Sign up for Mychart.  See how you do over the weekend with Cogentin and Aleve.  If you are still having difficulties next week I can send in a low-dose muscle relaxer for you.  Let me know this through Mychart.  For your nose start using nasal saline sprays for the next week or so.  If you are still having difficulty breathing with nasal clogging you can start over-the-counter Flonase which is an allergy medicine.  We will do a tetanus shot and check some labs today.  Come back and see me in about 3 months.  Make sure to continue follow-up with your psychiatrist as you have been.

## 2017-11-26 LAB — CBC WITH DIFFERENTIAL/PLATELET
BASOS ABS: 0 10*3/uL (ref 0.0–0.2)
BASOS: 1 %
EOS (ABSOLUTE): 0 10*3/uL (ref 0.0–0.4)
Eos: 1 %
Hematocrit: 41.3 % (ref 37.5–51.0)
Hemoglobin: 13.5 g/dL (ref 13.0–17.7)
IMMATURE GRANS (ABS): 0 10*3/uL (ref 0.0–0.1)
Immature Granulocytes: 0 %
LYMPHS ABS: 2.1 10*3/uL (ref 0.7–3.1)
Lymphs: 34 %
MCH: 27.2 pg (ref 26.6–33.0)
MCHC: 32.7 g/dL (ref 31.5–35.7)
MCV: 83 fL (ref 79–97)
MONOS ABS: 0.5 10*3/uL (ref 0.1–0.9)
Monocytes: 8 %
NEUTROS ABS: 3.6 10*3/uL (ref 1.4–7.0)
Neutrophils: 56 %
PLATELETS: 330 10*3/uL (ref 150–379)
RBC: 4.97 x10E6/uL (ref 4.14–5.80)
RDW: 13.9 % (ref 12.3–15.4)
WBC: 6.3 10*3/uL (ref 3.4–10.8)

## 2017-11-26 LAB — COMPREHENSIVE METABOLIC PANEL
A/G RATIO: 1.4 (ref 1.2–2.2)
ALK PHOS: 63 IU/L (ref 39–117)
ALT: 28 IU/L (ref 0–44)
AST: 25 IU/L (ref 0–40)
Albumin: 4.4 g/dL (ref 3.5–5.5)
BUN / CREAT RATIO: 10 (ref 9–20)
BUN: 10 mg/dL (ref 6–20)
Bilirubin Total: 0.3 mg/dL (ref 0.0–1.2)
CO2: 23 mmol/L (ref 20–29)
Calcium: 9.6 mg/dL (ref 8.7–10.2)
Chloride: 100 mmol/L (ref 96–106)
Creatinine, Ser: 1.03 mg/dL (ref 0.76–1.27)
GFR calc non Af Amer: 104 mL/min/{1.73_m2} (ref >59)
GFR, EST AFRICAN AMERICAN: 120 mL/min/{1.73_m2} (ref >59)
GLUCOSE: 130 mg/dL — AB (ref 65–99)
Globulin, Total: 3.1 g/dL (ref 1.5–4.5)
POTASSIUM: 4.6 mmol/L (ref 3.5–5.2)
Sodium: 139 mmol/L (ref 134–144)
TOTAL PROTEIN: 7.5 g/dL (ref 6.0–8.5)

## 2017-11-26 LAB — HIV ANTIBODY (ROUTINE TESTING W REFLEX): HIV Screen 4th Generation wRfx: NONREACTIVE

## 2017-11-26 LAB — VALPROIC ACID LEVEL: Valproic Acid Lvl: 48 ug/mL — ABNORMAL LOW (ref 50–100)

## 2017-11-28 ENCOUNTER — Telehealth: Payer: Self-pay

## 2017-11-28 NOTE — Telephone Encounter (Signed)
Received fax from [facility] that patient is requesting a medication for spasms.  Ples SpecterAlisa Brake, RN [facility] RN)

## 2017-11-30 MED ORDER — BACLOFEN 10 MG PO TABS: 5.0000 mg | ORAL_TABLET | Freq: Two times a day (BID) | ORAL | 0 refills | Status: DC | PRN

## 2017-11-30 NOTE — Telephone Encounter (Signed)
See last OV note AVS for details.  Will send in Baclofen

## 2017-12-26 ENCOUNTER — Ambulatory Visit (HOSPITAL_COMMUNITY): Payer: Self-pay | Admitting: Licensed Clinical Social Worker

## 2018-02-10 ENCOUNTER — Ambulatory Visit (HOSPITAL_COMMUNITY): Payer: Self-pay | Admitting: Psychiatry

## 2018-02-10 ENCOUNTER — Encounter

## 2018-02-16 ENCOUNTER — Ambulatory Visit (INDEPENDENT_AMBULATORY_CARE_PROVIDER_SITE_OTHER): Payer: BC Managed Care – PPO | Admitting: Family Medicine

## 2018-02-16 ENCOUNTER — Other Ambulatory Visit: Payer: Self-pay

## 2018-02-16 ENCOUNTER — Encounter: Payer: Self-pay | Admitting: Family Medicine

## 2018-02-16 VITALS — BP 124/62 | HR 65 | Temp 98.1°F | Ht 72.0 in | Wt 253.2 lb

## 2018-02-16 DIAGNOSIS — E669 Obesity, unspecified: Secondary | ICD-10-CM | POA: Insufficient documentation

## 2018-02-16 DIAGNOSIS — R635 Abnormal weight gain: Secondary | ICD-10-CM

## 2018-02-16 DIAGNOSIS — F3110 Bipolar disorder, current episode manic without psychotic features, unspecified: Secondary | ICD-10-CM

## 2018-02-16 DIAGNOSIS — F19959 Other psychoactive substance use, unspecified with psychoactive substance-induced psychotic disorder, unspecified: Secondary | ICD-10-CM

## 2018-02-16 DIAGNOSIS — F3162 Bipolar disorder, current episode mixed, moderate: Secondary | ICD-10-CM | POA: Diagnosis not present

## 2018-02-16 DIAGNOSIS — Z79899 Other long term (current) drug therapy: Secondary | ICD-10-CM | POA: Diagnosis not present

## 2018-02-16 MED ORDER — ARIPIPRAZOLE ER 400 MG IM SRER: 400.0000 mg | Freq: Once | INTRAMUSCULAR | Status: DC

## 2018-02-16 MED ORDER — ARIPIPRAZOLE 9.75 MG/1.3ML IM SOLN: INTRAMUSCULAR | 0 refills | Status: DC

## 2018-02-16 NOTE — Progress Notes (Signed)
Subjective:    Kyle Mcmahon is a 22 y.o. male who presents to [facility] today for FU for hospitlization for bipolar disorder:  1.  Bipolar disorder:  Ongoing issue for patient.  I saw him in March.  At that visit he was having difficulties with muscle spasm secondary to use of Invega.  He was taken off of this medication by psychiatrist at [facility] and had another hospitalization for manic episode secondary to bipolar disorder type I.  Since that hospitalization he medically withdrew from school.  He is now living back home here for his parent having to reapply for reentry back to [facility].  He is seeing both a Teacher, music and a Social worker and working part-time at [employer] here in [place].  He plans to return to [facility] next month and continue with psychiatry and counseling on there.  His psychiatrist is recommended he obtain a host of labs today including Depakote level, CBC, lipid, c-Met, TSH, fasting CBG.  He also recommended starting metformin.  [name] has noted about a 20 pound weight gain since being started on Depakote.  He does report "lots of stress" because of having to reapply for reentry in the fall.  However he denies any suicidal homicidal ideation.  He overall feels optimistic about his future.  Believes his depression is more adjustment and situational.  I personally reviewed all of his labwork and paperwork from the outside hospital system.     ROS as above per HPI.    The following portions of the patient's history were reviewed and updated as appropriate: allergies, current medications, past medical history, family and social history, and problem list. Patient is a nonsmoker.    PMH reviewed.  Past Medical History:  Diagnosis Date  . Depression    Past Surgical History:  Procedure Laterality Date  . HERNIA REPAIR     baby  . 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 Current Outpatient Medications  Medication Sig Dispense Refill   . baclofen (LIORESAL) 10 MG tablet Take 0.5-1 tablets (5-10 mg total) by mouth 2 (two) times daily as needed for muscle spasms. 30 each 0  . divalproex (DEPAKOTE ER) 500 MG 24 hr tablet Take 1 tablet (500 mg total) by mouth daily. For mood stabilization 30 tablet 0  . hydrOXYzine (ATARAX/VISTARIL) 50 MG tablet Take 1 tablet (50 mg total) by mouth every 6 (six) hours as needed for anxiety. 60 tablet 0  . paliperidone (INVEGA SUSTENNA) 156 MG/ML SUSP injection Inject 1 mL (156 mg total) into the muscle once for 1 dose. (Due 11-28-17): For mood control 1 mL 0  . paliperidone (INVEGA SUSTENNA) 156 MG/ML SUSP injection Inject 1 mL (156 mg total) into the muscle every 30 (thirty) days. (Due on 12-29-17): For mood control 0.9 mL 0  . paliperidone (INVEGA) 6 MG 24 hr tablet Take 1 tablet (6 mg total) by mouth at bedtime. For mood control 15 tablet 0  . traZODone (DESYREL) 50 MG tablet Take 1 tablet (50 mg) by mouth at bedtime: For sleep 30 tablet 0   No current facility-administered medications for this visit.      Objective:   Physical Exam BP 124/62   Pulse 65   Temp 98.1 F (36.7 C) (Oral)   Ht 6' (1.829 m)   Wt 253 lb 3.2 oz (114.9 kg)   SpO2 98%   BMI 34.34 kg/m  Gen:  Alert, cooperative patient who appears stated age  in no acute distress.  Vital signs reviewed. HEENT: EOMI,  MMM Cardiac:  Regular rate and rhythm without murmur auscultated.  Good S1/S2. Pulm:  Clear to auscultation bilaterally with good air movement.  No wheezes or rales noted.   Abd:  Soft/nondistended/nontender.   Exts: Non edematous BL  LE, warm and well perfused.  Psych: Linear and coherent thought process.  He did seem to have a bit of a flat affect initially when his mom was present in the room.  However he perked up after she left and was more open with me.  No hallucinations.  No manic episodes or symptoms.  No results found for this or any previous visit (from the past 72 hour(s)).

## 2018-02-16 NOTE — Assessment & Plan Note (Addendum)
Possibly due to Depakote.  Checking labs as above.  Not engaged in regular exercise.

## 2018-02-16 NOTE — Assessment & Plan Note (Signed)
Triggered by [facility].  Not using currently.

## 2018-02-16 NOTE — Patient Instructions (Signed)
It was good to see you today!  Let me know if you have any questions, even when [facility] [facility] [place] [facility].  We are checking labwork today.  I'll let you know those results.  We'll make a decision on your metformin based on the labs.

## 2018-02-16 NOTE — Assessment & Plan Note (Signed)
Recent hospitalization.  He is now on Abilify injections monthly and 500 mg of Depakote. -We are going to check a Depakote level today. -Muscle checking some of the labs recommended by psychiatry. -I will hold off on starting metformin until we see the results of his fasting CBG which we part of his c-Met as he has not eaten yet today and his creatinine. -Overall he feels hopeful about the future.  He is going to continue his medications and stay away from [facility] use which has triggered manic episodes and psychiatric admissions in the past.

## 2018-02-17 LAB — CBC WITH DIFFERENTIAL/PLATELET
BASOS: 1 %
Basophils Absolute: 0 10*3/uL (ref 0.0–0.2)
EOS (ABSOLUTE): 0 10*3/uL (ref 0.0–0.4)
EOS: 1 %
HEMATOCRIT: 42.6 % (ref 37.5–51.0)
Hemoglobin: 14.1 g/dL (ref 13.0–17.7)
IMMATURE GRANS (ABS): 0 10*3/uL (ref 0.0–0.1)
IMMATURE GRANULOCYTES: 0 %
LYMPHS: 40 %
Lymphocytes Absolute: 2.4 10*3/uL (ref 0.7–3.1)
MCH: 27.9 pg (ref 26.6–33.0)
MCHC: 33.1 g/dL (ref 31.5–35.7)
MCV: 84 fL (ref 79–97)
Monocytes Absolute: 0.6 10*3/uL (ref 0.1–0.9)
Monocytes: 11 %
NEUTROS PCT: 47 %
Neutrophils Absolute: 2.9 10*3/uL (ref 1.4–7.0)
Platelets: 283 10*3/uL (ref 150–450)
RBC: 5.05 x10E6/uL (ref 4.14–5.80)
RDW: 15.1 % (ref 12.3–15.4)
WBC: 5.9 10*3/uL (ref 3.4–10.8)

## 2018-02-17 LAB — T3, FREE: T3, Free: 3.4 pg/mL (ref 2.0–4.4)

## 2018-02-17 LAB — COMPREHENSIVE METABOLIC PANEL
A/G RATIO: 1.6 (ref 1.2–2.2)
ALBUMIN: 4.5 g/dL (ref 3.5–5.5)
ALT: 22 IU/L (ref 0–44)
AST: 21 IU/L (ref 0–40)
Alkaline Phosphatase: 63 IU/L (ref 39–117)
BILIRUBIN TOTAL: 0.3 mg/dL (ref 0.0–1.2)
BUN / CREAT RATIO: 17 (ref 9–20)
BUN: 15 mg/dL (ref 6–20)
CALCIUM: 9.6 mg/dL (ref 8.7–10.2)
CO2: 24 mmol/L (ref 20–29)
Chloride: 101 mmol/L (ref 96–106)
Creatinine, Ser: 0.87 mg/dL (ref 0.76–1.27)
GFR, EST AFRICAN AMERICAN: 144 mL/min/{1.73_m2} (ref >59)
GFR, EST NON AFRICAN AMERICAN: 124 mL/min/{1.73_m2} (ref >59)
GLOBULIN, TOTAL: 2.8 g/dL (ref 1.5–4.5)
Glucose: 88 mg/dL (ref 65–99)
POTASSIUM: 4.9 mmol/L (ref 3.5–5.2)
Sodium: 140 mmol/L (ref 134–144)
Total Protein: 7.3 g/dL (ref 6.0–8.5)

## 2018-02-17 LAB — LIPID PANEL
CHOL/HDL RATIO: 3.7 ratio (ref 0.0–5.0)
Cholesterol, Total: 169 mg/dL (ref 100–199)
HDL: 46 mg/dL (ref >39)
LDL Calculated: 108 mg/dL — ABNORMAL HIGH (ref 0–99)
Triglycerides: 76 mg/dL (ref 0–149)
VLDL Cholesterol Cal: 15 mg/dL (ref 5–40)

## 2018-02-17 LAB — T4, FREE: FREE T4: 1.13 ng/dL (ref 0.82–1.77)

## 2018-02-17 LAB — TSH: TSH: 2.55 u[IU]/mL (ref 0.450–4.500)

## 2018-02-17 LAB — VALPROIC ACID LEVEL: VALPROIC ACID LVL: 41 ug/mL — AB (ref 50–100)

## 2018-02-21 ENCOUNTER — Telehealth: Payer: Self-pay | Admitting: Family Medicine

## 2018-02-21 NOTE — Telephone Encounter (Signed)
Pt mother called and was wanting to see if her sons lab results were back yet. She said the best number to contact is [phone]

## 2018-02-22 NOTE — Telephone Encounter (Signed)
I called and spoke with Kyle Mcmahon to make sure he had gotten my Mychart message.  He had seen the results and had no questions.  I let him know that he can tell his mom the results if he wants, but I cannot disclose them to her as he's over 18 as they are his lab results.  He expressed understanding.    If mom calls back, let her know that we can't disclose them without his explicit consent.  He can sign a release of information form for his chart if he desires.  Thanks!

## 2018-12-26 ENCOUNTER — Telehealth (INDEPENDENT_AMBULATORY_CARE_PROVIDER_SITE_OTHER): Payer: BC Managed Care – PPO | Admitting: Family Medicine

## 2018-12-26 ENCOUNTER — Telehealth: Payer: Self-pay | Admitting: Family Medicine

## 2018-12-26 ENCOUNTER — Other Ambulatory Visit: Payer: Self-pay

## 2018-12-26 DIAGNOSIS — R0689 Other abnormalities of breathing: Secondary | ICD-10-CM | POA: Diagnosis not present

## 2018-12-26 DIAGNOSIS — R635 Abnormal weight gain: Secondary | ICD-10-CM

## 2018-12-26 NOTE — Progress Notes (Signed)
[facility] Telemedicine Visit  Patient consented to have virtual visit. Method of visit: Telephone  Encounter participants: Patient: Kyle Mcmahon - located at home Provider: Leland Her - located at [facility] Others (if applicable): none  Chief Complaint: noisy breathing  HPI:  Patient states that he has had multiple people around him tell him that he has been breathing weird and noisy for the last 1 month.  He has been told that his breathing has been very audible and is concerning.  He feels fine without any shortness of breath, cough or fever.  He denies wheezing.  It happens all the time including at rest and is not worsened by exertion.  He wonders if this has been exacerbated by some recent weight gain.  He states that he has probably gained about 80 pounds in the last 6 months.  His father would like him to get his cholesterol checked given his weight gain and strong family history.    ROS: per HPI  Pertinent PMHx: bipolar disorder on atypical antipsychotics  Exam:  Respiratory: very noisy audible breathing without tachypnea  Assessment/Plan:  Noisy breathing Uncertain etiology.  Given that this is been ongoing for the last 1 month unlikely to be COVID.  However may have been exacerbated if he is developing symptoms of COVID at this time.  Discussed self-monitoring and self isolation at home.  Given CDC guidelines for help to isolate at home as he lives with several family members when to discontinue and how.  Discussed that if this continues after the need for social isolation is over that he would benefit from PFTs.  May be due to weight gain as he is now 273 pounds which puts him at a BMI of 37.  However per chart review he was 253 in June 2019 so his weight gain is not as dramatic as he stated.    Weight gain Patient is on antipsychotics which could cause weight gain and hyperlipidemia.  He also has a strong family history.  Discussed checking lipid panel  when need for social isolation is over.    Time spent during visit with patient: 7 minutes  Billing: yes  Leland Her, DO PGY-3, [facility] 12/26/2018 10:45 AM

## 2018-12-26 NOTE — Assessment & Plan Note (Signed)
Patient is on antipsychotics which could cause weight gain and hyperlipidemia.  He also has a strong family history.  Discussed checking lipid panel when need for social isolation is over.

## 2018-12-26 NOTE — Assessment & Plan Note (Signed)
Uncertain etiology.  Given that this is been ongoing for the last 1 month unlikely to be COVID.  However may have been exacerbated if he is developing symptoms of COVID at this time.  Discussed self-monitoring and self isolation at home.  Given CDC guidelines for help to isolate at home as he lives with several family members when to discontinue and how.  Discussed that if this continues after the need for social isolation is over that he would benefit from PFTs.  May be due to weight gain as he is now 273 pounds which puts him at a BMI of 37.  However per chart review he was 253 in June 2019 so his weight gain is not as dramatic as he stated.

## 2018-12-26 NOTE — Telephone Encounter (Signed)
Patient called back because he wanted to have a few more questions about difficulty breathing.  Evidently patient has had increased audible breathing over the past year.  He is gained about 80 pounds in the same period of time secondary to eating junk food (in his words) as well as being on multiple psych medications.  States diet consists mostly of fast food and carbohydrates.  He is not doing much for activity.  He has had no shortness of breath.  No chest pain or tightness.  No cough.  No fevers or chills.  He is snoring at night louder than usual.  This is new for him.  This is secondary to increased weight.  Recommended increasing activity at home.  Also cutting out/cutting back on carbohydrates.  No red flags.  Answered all questions.

## 2019-01-30 ENCOUNTER — Telehealth: Payer: Self-pay | Admitting: Family Medicine

## 2019-01-30 ENCOUNTER — Ambulatory Visit: Payer: BC Managed Care – PPO

## 2019-01-30 ENCOUNTER — Other Ambulatory Visit: Payer: Self-pay

## 2019-01-30 ENCOUNTER — Ambulatory Visit (INDEPENDENT_AMBULATORY_CARE_PROVIDER_SITE_OTHER): Payer: BC Managed Care – PPO | Admitting: Family Medicine

## 2019-01-30 DIAGNOSIS — H6123 Impacted cerumen, bilateral: Secondary | ICD-10-CM

## 2019-01-30 DIAGNOSIS — H6090 Unspecified otitis externa, unspecified ear: Secondary | ICD-10-CM | POA: Diagnosis not present

## 2019-01-30 MED ORDER — NEOMYCIN-POLYMYXIN-HC 3.5-10000-1 OT SOLN: 4.0000 [drp] | Freq: Four times a day (QID) | OTIC | 0 refills | Status: DC

## 2019-01-30 NOTE — Telephone Encounter (Signed)
Mother is calling because she has not been able to pick her son's eardrops. They have not been called in yet. She would like this called into [facility] on [address] ASAP since her son's ear's are really hurting. jw

## 2019-01-31 NOTE — Telephone Encounter (Signed)
Dr. Manson Passey took care of sending this in per [facility]. April Zimmerman Rumple, CMA

## 2019-02-02 NOTE — Progress Notes (Signed)
Subjective:    Kyle Mcmahon is a 23 y.o. male who presents to [facility] today for ears clogged:  1.  Ears clogged: Bilaterally.  Patient is tried hydroperoxide without much relief.  He has some diminished hearing.  Is a frequent history of this in the past.  He is come in for ear irrigation previously as well.  No ear pain.  No recent URI symptoms.   Prev health:  Currently overdue for NA.  ROS as above per HPI.  Pertinently, no chest pain, palpitations, SOB, Fever, Chills, Abd pain, N/V/D.   The following portions of the patient's history were reviewed and updated as appropriate: allergies, current medications, past medical history, family and social history, and problem list. Patient is a nonsmoker.    PMH reviewed.  Past Medical History:  Diagnosis Date  . Depression    Past Surgical History:  Procedure Laterality Date  . HERNIA REPAIR     baby  . 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 Current Outpatient Medications  Medication Sig Dispense Refill  . ARIPiprazole (ABILIFY) 9.75 MG/1.3ML injection 400 mg once monthly - prescribed by psychiatrist 1.3 mL 0  . baclofen (LIORESAL) 10 MG tablet Take 0.5-1 tablets (5-10 mg total) by mouth 2 (two) times daily as needed for muscle spasms. 30 each 0  . divalproex (DEPAKOTE ER) 500 MG 24 hr tablet Take 1 tablet (500 mg total) by mouth daily. For mood stabilization 30 tablet 0  . neomycin-polymyxin-hydrocortisone (CORTISPORIN) OTIC solution Place 4 drops into both ears 4 (four) times daily. 10 mL 0  . paliperidone (INVEGA) 6 MG 24 hr tablet Take 1 tablet (6 mg total) by mouth at bedtime. For mood control 15 tablet 0   No current facility-administered medications for this visit.      Objective:   Physical Exam There were no vitals taken for this visit. Gen:  Patient sitting on exam table, appears stated age in no acute distress Head: Normocephalic atraumatic Eyes: EOMI, PERRL, sclera and conjunctiva  non-erythematous Ears: Canals with cerumen impacted bilaterally.  Unable to visualize TM. Nose:  Nasal turbinates grossly enlarged bilaterally.  Mouth: Mucosa membranes moist. Tonsils +2, nonenlarged, non-erythematous. Neck: No cervical lymphadenopathy noted  Impression/plan: 1.  Cerumen impaction: -Ear irrigation done here in clinic. -Able to irrigate entire right ear.  TM intact.  Left ear was only about half successful.  Still with significant cerumen.  Prescribed Cortisporin drops and follow-up in 2 weeks to assess for improvement.

## 2019-02-19 ENCOUNTER — Encounter: Payer: BC Managed Care – PPO | Admitting: Family Medicine

## 2019-03-06 ENCOUNTER — Ambulatory Visit: Payer: BC Managed Care – PPO | Admitting: Family Medicine

## 2019-03-06 ENCOUNTER — Encounter: Payer: Self-pay | Admitting: Family Medicine

## 2019-03-06 ENCOUNTER — Other Ambulatory Visit: Payer: Self-pay

## 2019-03-06 VITALS — BP 118/80 | HR 80 | Ht 72.0 in | Wt 265.0 lb

## 2019-03-06 DIAGNOSIS — R7303 Prediabetes: Secondary | ICD-10-CM | POA: Diagnosis not present

## 2019-03-06 DIAGNOSIS — K2900 Acute gastritis without bleeding: Secondary | ICD-10-CM | POA: Diagnosis not present

## 2019-03-06 LAB — POCT GLYCOSYLATED HEMOGLOBIN (HGB A1C): HbA1c, POC (controlled diabetic range): 5.9 % (ref 0.0–7.0)

## 2019-03-06 MED ORDER — ONDANSETRON HCL 4 MG PO TABS: 4.0000 mg | ORAL_TABLET | Freq: Three times a day (TID) | ORAL | 0 refills | Status: DC | PRN

## 2019-03-06 MED ORDER — LOPERAMIDE HCL 2 MG PO TABS: 2.0000 mg | ORAL_TABLET | Freq: Four times a day (QID) | ORAL | 0 refills | Status: DC | PRN

## 2019-03-06 NOTE — Progress Notes (Signed)
  Established Patient - Clinic Visit Subjective  Subjective  Patient ID: MRN [id number]  Date of birth: Mar 19, 1996   PCP: Kyle Pike, MD Name: Kyle Mcmahon, 23 y.o. male  CC: Annual Exam and Diarrhea  # Diarrhea  Nausea started on Friday (4 days ago). Every time he eats, getting really nauseous. Yesterday, had first episode of bad diarrhea and some came out green. No cramping, no vomiting. Eating a lot of fast food. Never had green diarrhea previously. 2-3 episodes per day, very watery. Not much gas.  Yesterday took one anti-nausea medication with some relief. No recent ibuprofren use. No recent exposure to COVID + individuals. Typically, BMs are formed, brown and occur daily.  Patient denies any food allergies.  He denies nausea he reports that on Father's Day, 2 days ago, he was at a cookout where he ate hamburgers and baked beans.  He denies any mayonnaise-based salads.  Yesterday, he had [employer] and steak for dinner.  Patient is not on any new medications at this time.  He currently takes Lamictal and metformin, both of which she has been taking chronically.  ROS: See HPI  HISTORY Meds  Allergies: Reviewed as appropriate  Pertinent PMHx: Obesity, Bipolar I. No hx of IBS Social Hx: Kyle Mcmahon reports that he has never smoked. He has never used smokeless tobacco. He reports current alcohol use. He reports previous drug use. Social History   Social History Narrative   Lives with mother sister and maternal aunt.       Never sexually active. Partner preference: male.       Objective   Objective  Physical Exam:  BP 118/80   Pulse 80   Ht 6' (1.829 m)   Wt 120.2 kg   SpO2 99%   BMI 35.94 kg/m  General: NAD, non-toxic, well-appearing, sitting comfortably in chair   HEENT: [place]/AT. PERRLA. EOMI.  Cardiovascular: RRR, normal S1, S2. B/L 2+ RP. No BLEE Respiratory: CTAB. No IWOB.  Abdomen: + BS. NT, ND, soft to palpation.  Extremities: Warm and well perfused. Moving  spontaneously.  Integumentary: No obvious rashes, lesions, trauma on general exam.  Pertinent Labs & Imaging:  A1C  5.9 today (up from 5.7) Reviewed in chart as appropriate   Assessment  Assessment & Plan  Gastritis Patient symptoms are acute in nature without any history of chronic bowel disease.  Unlikely to be medication induced as patient is not taking any new medications.  Differential includes gastritis, food poisoning, gastroparesis.  A1c returned at 5.9, up from 5.7.  Gastritis repeat was in, would expect self resolution.  Will treat symptomatically with Zofran and loperamide.  Should patient not improve in a couple of days, he should return for further work-up.   Prediabetes Patient currently taking metformin 500 mg.  Patient reports that he has been taking this for a couple of months.  A1c increased to 5.9 from 5.7.  Will continue to follow.  Recommendations for changes in lifestyle including diet and exercise.    Kyle Mcmahon, M.D. [facility]  PGY -1 03/09/2019, 5:25 AM

## 2019-03-06 NOTE — Patient Instructions (Addendum)
Dear Kyle Kyle Mcmahon C Kyle Mcmahon,   It was very nice to see you! Thank you for taking your time to come in to be seen. Today, we discussed the following:   Diarrhea   This is likely a viral "gastritis" which should go away on its own. There is no antibiotic or medicine that will solve the issue. Our goal will be to treat the symptoms of nausea and diarrhea. I have sent imodium and zofran to the pharmacy.   Imodium: please take 4mg  with next episode of diarrhea and then 2 mg with each episode afterwards   Zofran: 4 mg every 6 hours as needed   Please follow up  for concerning or worsening symptoms.  Annual Check  Please try to exercise more often, your BMI is elevated at 35.94 which qualifies as "obesity". Walks outside can be a good cardiovascular work out. It can also help with your mood stability. I am checking your A1C today. I will message you on MyChart with results and if there are any recommended changes to the Metformin.   Be well,   Dr. Genia Hotterachel Seville Brick [facility] [phone]   Obesity, Adult Obesity is the condition of having too much total body fat. Being overweight or obese means that your weight is greater than what is considered healthy for your body size. Obesity is determined by a measurement called BMI. BMI is an estimate of body fat and is calculated from height and weight. For adults, a BMI of 30 or higher is considered obese. Obesity can eventually lead to other health concerns and major illnesses, including:  Stroke.  Coronary artery disease (CAD).  Type 2 diabetes.  Some types of cancer, including cancers of the colon, breast, uterus, and gallbladder.  Osteoarthritis.  High blood pressure (hypertension).  High cholesterol.  Sleep apnea.  Gallbladder stones.  Infertility problems. What are the causes? The main cause of obesity is taking in (consuming) more calories than your body uses for energy. Other factors that contribute to this condition may  include:  Being born with genes that make you more likely to become obese.  Having a medical condition that causes obesity. These conditions include: ? Hypothyroidism. ? Polycystic ovarian syndrome (PCOS). ? Binge-eating disorder. ? Cushing syndrome.  Taking certain medicines, such as steroids, antidepressants, and seizure medicines.  Not being physically active (sedentary lifestyle).  Living where there are limited places to exercise safely or buy healthy foods.  Not getting enough sleep. What increases the risk? The following factors may increase your risk of this condition:  Having a family history of obesity.  Being a woman of African-American descent.  Being a man of Hispanic descent. What are the signs or symptoms? Having excessive body fat is the main symptom of this condition. How is this diagnosed? This condition may be diagnosed based on:  Your symptoms.  Your medical history.  A physical exam. Your health care provider may measure: ? Your BMI. If you are an adult with a BMI between 25 and less than 30, you are considered overweight. If you are an adult with a BMI of 30 or higher, you are considered obese. ? The distances around your hips and your waist (circumferences). These may be compared to each other to help diagnose your condition. ? Your skinfold thickness. Your health care provider may gently pinch a fold of your skin and measure it. How is this treated? Treatment for this condition often includes changing your lifestyle. Treatment may include some or all  of the following:  Dietary changes. Work with your health care provider and a dietitian to set a weight-loss goal that is healthy and reasonable for you. Dietary changes may include eating: ? Smaller portions. A portion size is the amount of a particular food that is healthy for you to eat at one time. This varies from person to person. ? Low-calorie or low-fat options. ? More whole grains, fruits, and  vegetables.  Regular physical activity. This may include aerobic activity (cardio) and strength training.  Medicine to help you lose weight. Your health care provider may prescribe medicine if you are unable to lose 1 pound a week after 6 weeks of eating more healthily and doing more physical activity.  Surgery. Surgical options may include gastric banding and gastric bypass. Surgery may be done if: ? Other treatments have not helped to improve your condition. ? You have a BMI of 40 or higher. ? You have life-threatening health problems related to obesity. Follow these instructions at home:  Eating and drinking   Follow recommendations from your health care provider about what you eat and drink. Your health care provider may advise you to: ? Limit fast foods, sweets, and processed snack foods. ? Choose low-fat options, such as low-fat milk instead of whole milk. ? Eat 5 or more servings of fruits or vegetables every day. ? Eat at home more often. This gives you more control over what you eat. ? Choose healthy foods when you eat out. ? Learn what a healthy portion size is. ? Keep low-fat snacks on hand. ? Avoid sugary drinks, such as soda, fruit juice, iced tea sweetened with sugar, and flavored milk. ? Eat a healthy breakfast.  Drink enough water to keep your urine clear or pale yellow.  Do not go without eating for long periods of time (do not fast) or follow a fad diet. Fasting and fad diets can be unhealthy and even dangerous. Physical Activity  Exercise regularly, as told by your health care provider. Ask your health care provider what types of exercise are safe for you and how often you should exercise.  Warm up and stretch before being active.  Cool down and stretch after being active.  Rest between periods of activity. Lifestyle  Limit the time that you spend in front of your TV, computer, or video game system.  Find ways to reward yourself that do not involve  food.  Limit alcohol intake to no more than 1 drink a day for nonpregnant women and 2 drinks a day for men. One drink equals 12 oz of beer, 5 oz of wine, or 1 oz of hard liquor. General instructions  Keep a weight loss journal to keep track of the food you eat and how much you exercise you get.  Take over-the-counter and prescription medicines only as told by your health care provider.  Take vitamins and supplements only as told by your health care provider.  Consider joining a support group. Your health care provider may be able to recommend a support group.  Keep all follow-up visits as told by your health care provider. This is important. Contact a health care provider if:  You are unable to meet your weight loss goal after 6 weeks of dietary and lifestyle changes. This information is not intended to replace advice given to you by your health care provider. Make sure you discuss any questions you have with your health care provider. Document Released: 10/07/2004 Document Revised: 02/02/2016 Document Reviewed: 06/18/2015 Elsevier  Interactive Patient Education  [employer].

## 2019-03-09 ENCOUNTER — Encounter: Payer: Self-pay | Admitting: Family Medicine

## 2019-03-09 DIAGNOSIS — K2970 Gastritis, unspecified, without bleeding: Secondary | ICD-10-CM

## 2019-03-09 DIAGNOSIS — R7303 Prediabetes: Secondary | ICD-10-CM | POA: Insufficient documentation

## 2019-03-09 HISTORY — DX: Gastritis, unspecified, without bleeding: K29.70

## 2019-03-09 NOTE — Assessment & Plan Note (Signed)
Patient symptoms are acute in nature without any history of chronic bowel disease.  Unlikely to be medication induced as patient is not taking any new medications.  Differential includes gastritis, food poisoning, gastroparesis.  A1c returned at 5.9, up from 5.7.  Gastritis repeat was in, would expect self resolution.  Will treat symptomatically with Zofran and loperamide.  Should patient not improve in a couple of days, he should return for further work-up.

## 2019-03-09 NOTE — Assessment & Plan Note (Signed)
Patient currently taking metformin 500 mg.  Patient reports that he has been taking this for a couple of months.  A1c increased to 5.9 from 5.7.  Will continue to follow.  Recommendations for changes in lifestyle including diet and exercise.

## 2019-04-06 ENCOUNTER — Ambulatory Visit: Payer: BC Managed Care – PPO | Admitting: Family Medicine

## 2019-04-06 ENCOUNTER — Other Ambulatory Visit: Payer: Self-pay

## 2019-04-06 ENCOUNTER — Encounter: Payer: Self-pay | Admitting: Family Medicine

## 2019-04-06 VITALS — BP 126/68 | HR 88 | Ht 72.0 in | Wt 271.0 lb

## 2019-04-06 DIAGNOSIS — E669 Obesity, unspecified: Secondary | ICD-10-CM | POA: Diagnosis not present

## 2019-04-06 DIAGNOSIS — R0683 Snoring: Secondary | ICD-10-CM | POA: Diagnosis not present

## 2019-04-06 NOTE — Progress Notes (Signed)
Subjective:     Patient ID: Kyle Mcmahon, male   DOB: 09-13-1996, 23 y.o.   MRN: [id number]  HPI Snoring/Obese: Noticed by mom a few months ago. Not worsening. He snores at night as well as during the day. Denies fatigue. He denies cough, no SOB, no fever. Denies sick contact. No asthma, he does not smoke.  Current Outpatient Medications on File Prior to Visit  Medication Sig Dispense Refill  . lamoTRIgine (LAMICTAL) 150 MG tablet Take 150 mg by mouth 2 (two) times daily.    . metFORMIN (GLUCOPHAGE) 500 MG tablet     . baclofen (LIORESAL) 10 MG tablet Take 0.5-1 tablets (5-10 mg total) by mouth 2 (two) times daily as needed for muscle spasms. (Patient not taking: Reported on 04/06/2019) 30 each 0  . divalproex (DEPAKOTE ER) 500 MG 24 hr tablet Take 1 tablet (500 mg total) by mouth daily. For mood stabilization (Patient not taking: Reported on 04/06/2019) 30 tablet 0  . hydrOXYzine (ATARAX/VISTARIL) 50 MG tablet Take by mouth.    . loperamide (IMODIUM A-D) 2 MG tablet Take 1 tablet (2 mg total) by mouth 4 (four) times daily as needed for diarrhea or loose stools. (Patient not taking: Reported on 04/06/2019) 30 tablet 0  . neomycin-polymyxin-hydrocortisone (CORTISPORIN) OTIC solution Place 4 drops into both ears 4 (four) times daily. (Patient not taking: Reported on 04/06/2019) 10 mL 0  . ondansetron (ZOFRAN) 4 MG tablet Take 1 tablet (4 mg total) by mouth every 8 (eight) hours as needed for nausea or vomiting. (Patient not taking: Reported on 04/06/2019) 20 tablet 0   No current facility-administered medications on file prior to visit.    Past Medical History:  Diagnosis Date  . Bipolar 1 disorder (HCC) 2019  . Depression    Vitals:   04/06/19 1044  BP: 126/68  Pulse: 88  SpO2: 98%  Weight: 271 lb (122.9 kg)  Height: 6' (1.829 m)  Body mass index is 36.75 kg/m.    Review of Systems  HENT:       Snoring  Respiratory: Negative.  Negative for apnea, cough, choking and wheezing.    Cardiovascular: Negative.   Gastrointestinal: Negative.   Genitourinary: Negative.   All other systems reviewed and are negative.      Objective:   Physical Exam Vitals signs and nursing note reviewed.  Constitutional:      Appearance: He is obese. He is not ill-appearing.  HENT:     Nose: No congestion.     Comments: ?? Mild tonsillar enlargement    Mouth/Throat:     Pharynx: No oropharyngeal exudate or posterior oropharyngeal erythema.  Neck:     Thyroid: No thyromegaly.  Cardiovascular:     Rate and Rhythm: Normal rate and regular rhythm.     Heart sounds: Normal heart sounds. No murmur.  Pulmonary:     Effort: Pulmonary effort is normal. No respiratory distress.     Breath sounds: Normal breath sounds. No wheezing or rhonchi.  Abdominal:     General: Bowel sounds are normal. There is no distension.     Palpations: Abdomen is soft. There is no mass.     Tenderness: There is no abdominal tenderness.  Musculoskeletal:        General: No swelling.  Neurological:     Mental Status: He is alert.        Assessment:     Snoring Obese    Plan:     Likely some element of OSA  due to his weight. Weight loss discussed as management of OSA. Referral for sleep study recommended. Red flag symptoms discussed. F/U with PCP for obesity management. He agreed with the plan and verbalized understanding.

## 2019-04-06 NOTE — Patient Instructions (Signed)

## 2019-04-18 ENCOUNTER — Telehealth: Payer: Self-pay | Admitting: *Deleted

## 2019-04-18 ENCOUNTER — Other Ambulatory Visit: Payer: Self-pay | Admitting: Family Medicine

## 2019-04-18 DIAGNOSIS — R0689 Other abnormalities of breathing: Secondary | ICD-10-CM

## 2019-04-18 DIAGNOSIS — R0683 Snoring: Secondary | ICD-10-CM

## 2019-04-18 NOTE — Addendum Note (Signed)
Addended by: Andrena Mews T on: 04/18/2019 04:21 PM   Modules accepted: Orders

## 2019-04-18 NOTE — Telephone Encounter (Signed)
Typically all we do is to send referral to sleep clinic for sleep study.  Kennyth Lose, please help with this referral.

## 2019-04-18 NOTE — Telephone Encounter (Signed)
Mom calls because sleep center never received referral for sleep study.  Upon review we need to put in order for sleep study, not amb referral.  I have placed the order, will send to MD for review and sign of order. Christen Bame, CMA

## 2019-04-18 NOTE — Addendum Note (Signed)
Addended by: Andrena Mews T on: 04/18/2019 04:10 PM   Modules accepted: Orders

## 2019-08-03 ENCOUNTER — Other Ambulatory Visit: Payer: Self-pay | Admitting: Family Medicine

## 2019-08-03 DIAGNOSIS — R0683 Snoring: Secondary | ICD-10-CM

## 2019-08-03 NOTE — Progress Notes (Signed)
Prior sleep study referral never went through.  Re-sending as an order.

## 2019-08-04 IMAGING — DX DG ABDOMEN ACUTE W/ 1V CHEST
3 series · 3 of 3 positions shown · non-contrast
Comparison: 08/12/2014

CLINICAL DATA: Constipation and nausea onset 2 nights ago.

EXAM:
DG ABDOMEN ACUTE W/ 1V CHEST

[chest pa]
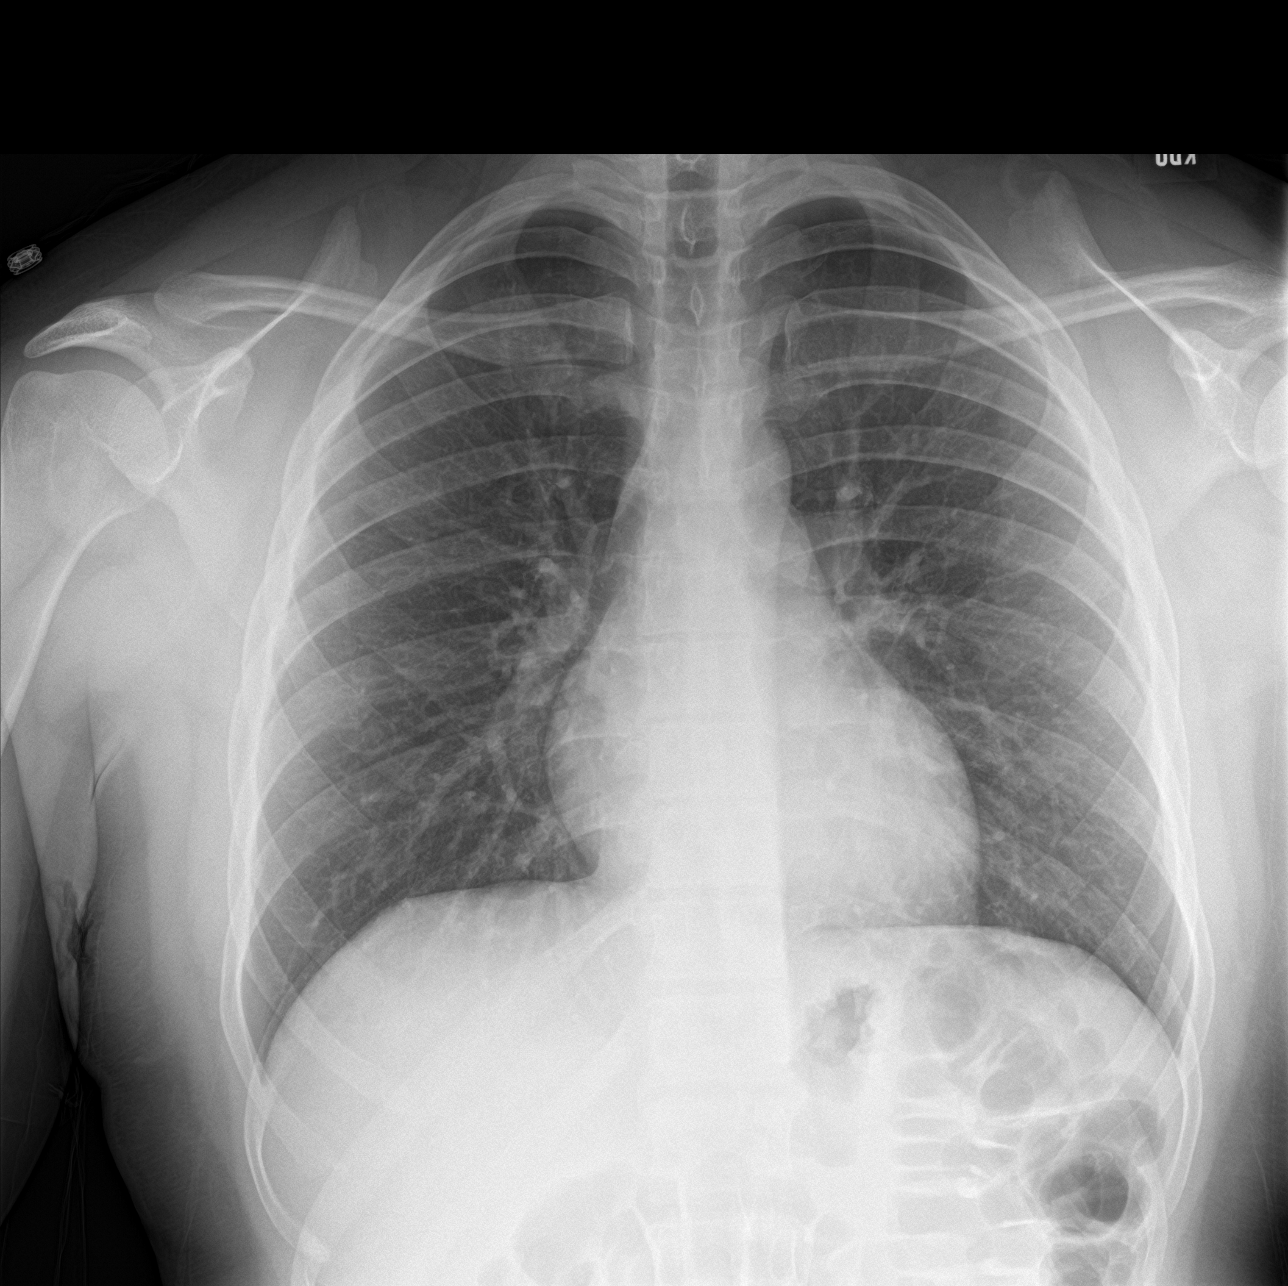

[abdomen erect]
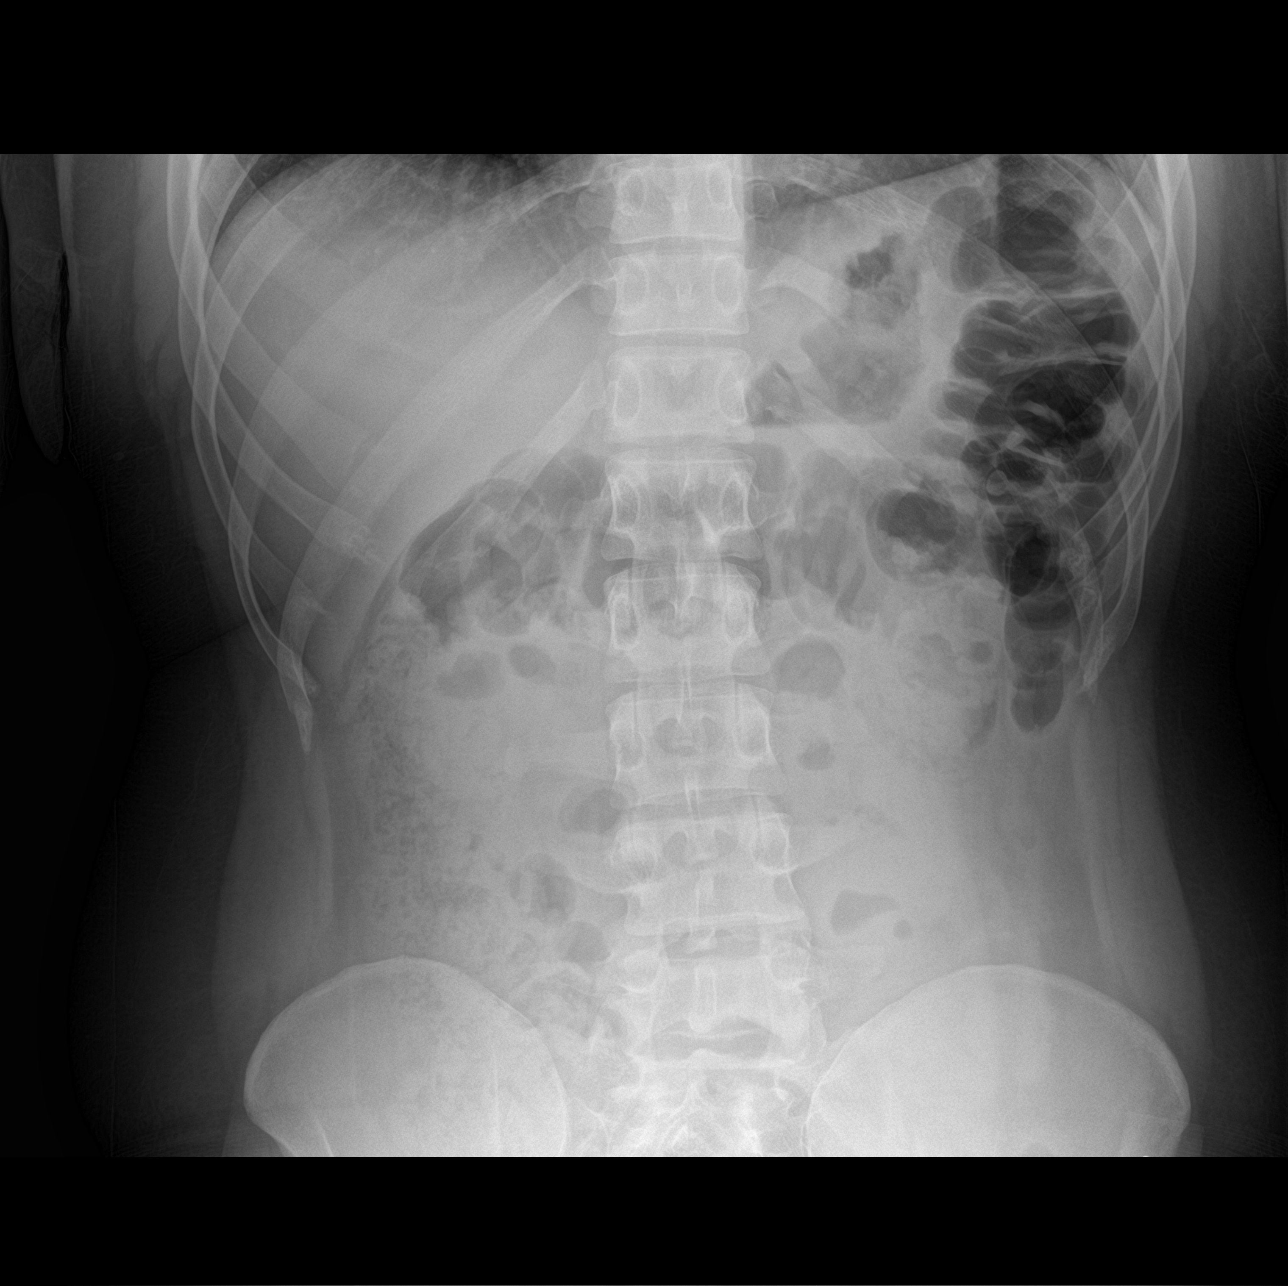

[abdomen supine]
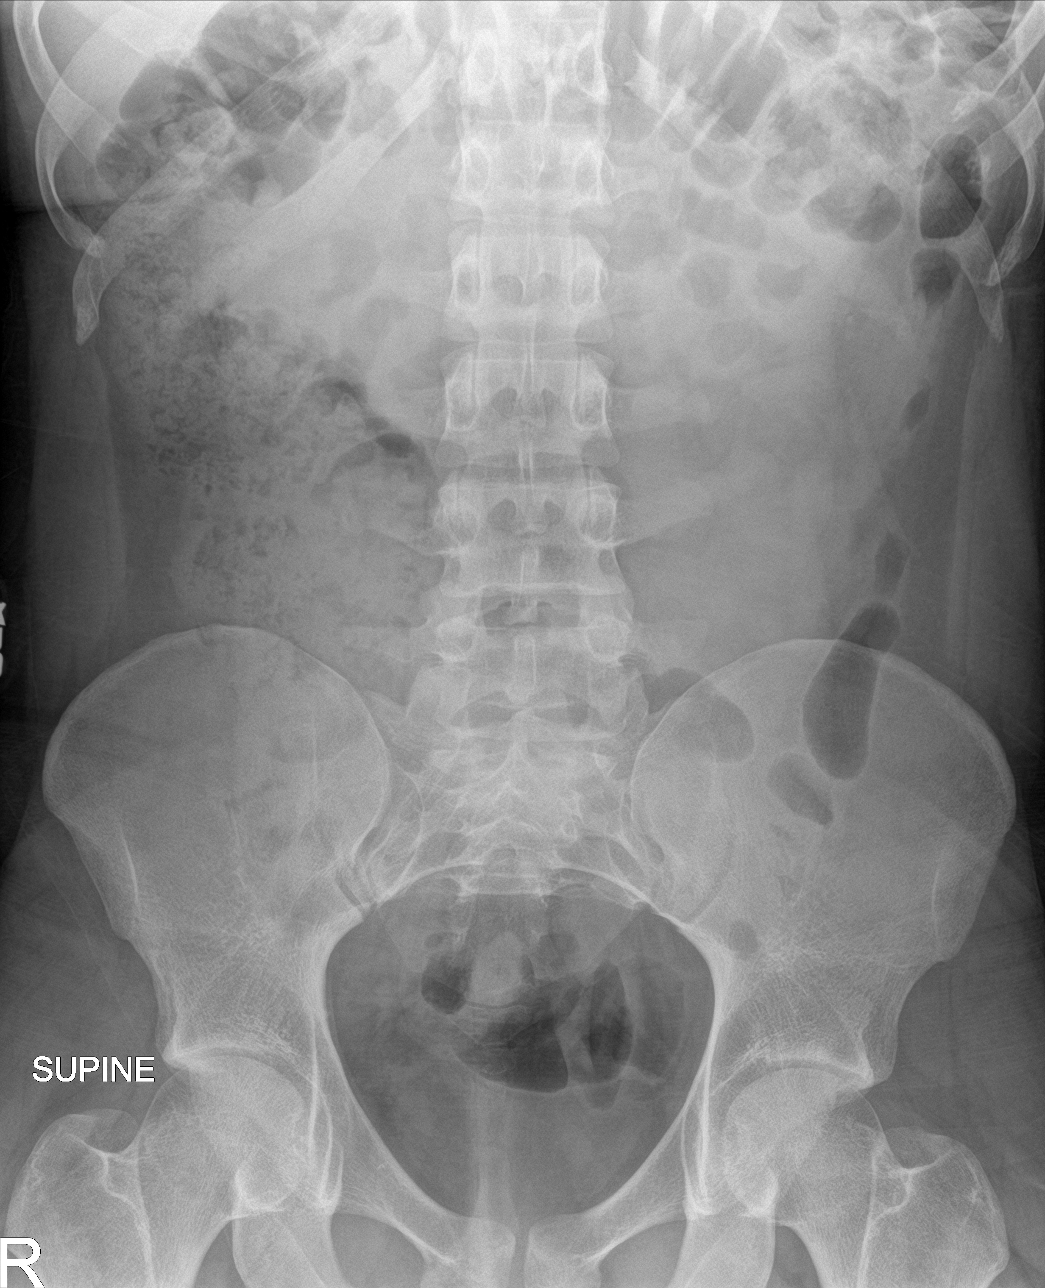

[3 of 3 positions shown; findings below may reference images not displayed]

FINDINGS: There is no evidence of dilated bowel loops or free intraperitoneal
air. A moderate amount of fecal residue is seen within the cecum and
ascending colon as well as at the splenic flexure. No bowel
obstruction is seen. No radiopaque calculi or other significant
radiographic abnormality is seen. Heart size and mediastinal
contours are within normal limits. Both lungs are clear.
IMPRESSION: Moderate stool burden in the right colon and splenic flexure. No
mechanical source of bowel obstruction is noted. No acute
cardiopulmonary disease.

## 2019-08-13 ENCOUNTER — Telehealth: Payer: Self-pay

## 2019-08-13 NOTE — Telephone Encounter (Signed)
Pt has not heard from anyone about the sleep study. Should pt call to schedule an appt with someone? Please call pt and let him know where this is in the process. Ottis Stain, CMA

## 2019-08-14 NOTE — Telephone Encounter (Signed)
Spoke with [employer] about it. She said she'll look into it.

## 2019-08-24 ENCOUNTER — Other Ambulatory Visit: Payer: Self-pay

## 2019-08-24 ENCOUNTER — Inpatient Hospital Stay (HOSPITAL_COMMUNITY): Admission: RE | Admit: 2019-08-24 | Payer: BC Managed Care – PPO | Source: Ambulatory Visit

## 2019-08-24 DIAGNOSIS — Z20822 Contact with and (suspected) exposure to covid-19: Secondary | ICD-10-CM

## 2019-08-25 LAB — NOVEL CORONAVIRUS, NAA: SARS-CoV-2, NAA: NOT DETECTED

## 2019-08-27 ENCOUNTER — Ambulatory Visit (HOSPITAL_BASED_OUTPATIENT_CLINIC_OR_DEPARTMENT_OTHER): Payer: BC Managed Care – PPO | Attending: Family Medicine | Admitting: Internal Medicine

## 2019-08-27 ENCOUNTER — Other Ambulatory Visit: Payer: Self-pay

## 2019-08-27 VITALS — Temp 95.2°F | Ht 72.0 in | Wt 270.0 lb

## 2019-08-27 DIAGNOSIS — R0683 Snoring: Secondary | ICD-10-CM | POA: Insufficient documentation

## 2019-08-27 DIAGNOSIS — G4733 Obstructive sleep apnea (adult) (pediatric): Secondary | ICD-10-CM | POA: Diagnosis not present

## 2019-08-27 HISTORY — DX: Snoring: R06.83

## 2019-09-02 DIAGNOSIS — R0683 Snoring: Secondary | ICD-10-CM

## 2019-09-02 NOTE — Procedures (Signed)
    Patient Name: Kyle, Mcmahon Date: 08/27/2019 Gender: Male D.O.B: Feb 02, 1996 Age (years): 23 Referring Provider: Talbert Cage Height (inches): 72 Interpreting Physician: Baird Lyons MD, ABSM Weight (lbs): 270 RPSGT: Laren Everts BMI: 37 MRN: [id number] Neck Size: 17.50  CLINICAL INFORMATION Sleep Study Type: NPSG Indication for sleep study: Obesity, Snoring Epworth Sleepiness Score:  9  SLEEP STUDY TECHNIQUE As per the AASM Manual for the Scoring of Sleep and Associated Events v2.3 (April 2016) with a hypopnea requiring 4% desaturations.  The channels recorded and monitored were frontal, central and occipital EEG, electrooculogram (EOG), submentalis EMG (chin), nasal and oral airflow, thoracic and abdominal wall motion, anterior tibialis EMG, snore microphone, electrocardiogram, and pulse oximetry.  MEDICATIONS Medications self-administered by patient taken the night of the study : LAMOTRIGINE  SLEEP ARCHITECTURE The study was initiated at 10:22:22 PM and ended at 4:43:28 AM.  Sleep onset time was 68.6 minutes and the sleep efficiency was 77.9%%. The total sleep time was 297 minutes.  Stage REM latency was 131.0 minutes.  The patient spent 3.4%% of the night in stage N1 sleep, 81.8%% in stage N2 sleep, 0.0%% in stage N3 and 14.8% in REM.  Alpha intrusion was absent.  Supine sleep was 43.21%.  RESPIRATORY PARAMETERS The overall apnea/hypopnea index (AHI) was 8.9 per hour. There were 5 total apneas, including 5 obstructive, 0 central and 0 mixed apneas. There were 39 hypopneas and 7 RERAs.  The AHI during Stage REM sleep was 46.4 per hour.  AHI while supine was 7.5 per hour.  The mean oxygen saturation was 94.7%. The minimum SpO2 during sleep was 85.0%.  moderate snoring was noted during this study.  CARDIAC DATA The 2 lead EKG demonstrated sinus rhythm. The mean heart rate was 76.2 beats per minute. Other EKG findings include: None.  LEG  MOVEMENT DATA The total PLMS were 0 with a resulting PLMS index of 0.0. Associated arousal with leg movement index was 0.0 .  IMPRESSIONS - Mild obstructive sleep apnea occurred during this study (AHI = 8.9/h). - No significant central sleep apnea occurred during this study (CAI = 0.0/h). - Mild oxygen desaturation was noted during this study (Min O2 = 85.0%). Mean sat 95.1%. - The patient snored with moderate snoring volume. - No cardiac abnormalities were noted during this study. - Clinically significant periodic limb movements did not occur during sleep. No significant associated arousals.  DIAGNOSIS - Obstructive Sleep Apnea (327.23 [G47.33 ICD-10]  RECOMMENDATIONS - Treatment for mild OSA is directed at symptoms. Conservative measures may include observation, weight loss and sleep position off back. - Other options,including CPAP and a fitted oral appliance, would be based on clinical judgment. - Be careful with alcohol, sedatives and other CNS depressants that may worsen sleep apnea and disrupt normal sleep architecture. - Sleep hygiene should be reviewed to assess factors that may improve sleep quality. - Weight management and regular exercise should be initiated or continued if appropriate.  [Electronically signed] 09/02/2019 09:55 AM  Baird Lyons MD, ABSM Diplomate, American Board of Sleep Medicine   NPI: [id number]                         [place] [place] [facility]  ELECTRONICALLY SIGNED ON:  09/02/2019, 9:52 AM [place] PH: [phone]   FX: [phone] [place]

## 2019-09-05 ENCOUNTER — Encounter: Payer: Self-pay | Admitting: Podiatry

## 2019-09-05 ENCOUNTER — Other Ambulatory Visit: Payer: Self-pay

## 2019-09-05 ENCOUNTER — Ambulatory Visit: Payer: BC Managed Care – PPO | Admitting: Podiatry

## 2019-09-05 VITALS — BP 111/76

## 2019-09-05 DIAGNOSIS — L600 Ingrowing nail: Secondary | ICD-10-CM | POA: Diagnosis not present

## 2019-09-05 NOTE — Progress Notes (Signed)
Subjective:   Patient ID: Kyle Mcmahon, male   DOB: 23 y.o.   MRN: [id number]   HPI Patient presents stating his had a chronic ingrown toenail of his left big toe that is been sore and making it hard to wear shoe gear.  Patient states he is tried to trim it without relief of symptoms and it is gradually becoming more of an issue for him and has not noted drainage.  Patient does not smoke likes to be active   Review of Systems  All other systems reviewed and are negative.       Objective:  Physical Exam Vitals and nursing note reviewed.  Constitutional:      Appearance: He is well-developed.  Pulmonary:     Effort: Pulmonary effort is normal.  Musculoskeletal:        General: Normal range of motion.  Skin:    General: Skin is warm.  Neurological:     Mental Status: He is alert.     Neurovascular status intact muscle strength found to be adequate range of motion within normal limits.  Patient is found to have exquisite discomfort left hallux left medial border that is painful when pressed in making shoe gear difficult.  Patient has good digital perfusion is well oriented.     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  I explained procedure risk patient wants surgery and I infiltrated the left hallux 60 mg like Marcaine mixture sterile prep and remove the lateral border exposed matrix and applied phenol 3 applications 30 seconds followed by alcohol lavage sterile dressing.  Gave instructions on soaks reappoint and encouraged to call with any questions concerns which may arise

## 2019-09-18 ENCOUNTER — Ambulatory Visit: Payer: BC Managed Care – PPO | Attending: Internal Medicine

## 2019-09-18 DIAGNOSIS — Z20822 Contact with and (suspected) exposure to covid-19: Secondary | ICD-10-CM

## 2019-09-20 LAB — NOVEL CORONAVIRUS, NAA: SARS-CoV-2, NAA: NOT DETECTED

## 2020-04-24 ENCOUNTER — Other Ambulatory Visit: Payer: Self-pay

## 2020-04-24 ENCOUNTER — Encounter: Payer: Self-pay | Admitting: Podiatry

## 2020-04-24 ENCOUNTER — Ambulatory Visit: Payer: BC Managed Care – PPO | Admitting: Podiatry

## 2020-04-24 DIAGNOSIS — L600 Ingrowing nail: Secondary | ICD-10-CM

## 2020-04-24 NOTE — Progress Notes (Signed)
Subjective:   Patient ID: Kyle Mcmahon, male   DOB: 24 y.o.   MRN: [id number]   HPI Patient presents stating he has a significant cracking of his left hallux nail as he is concerned about injury and what might of occurred does not remember specific injuries.  States his ingrown toenails doing much better   ROS      Objective:  Physical Exam  Neurovascular status intact with a cracked left hallux nail across the center portion of the nailbed localized with no edema erythema associated with      Assessment:  Traumatized left hallux nail with cracking     Plan:  Debridement accomplished and it seems fine with no active drainage and I advised if it does get worse we will need to consider nail removal

## 2020-04-29 ENCOUNTER — Other Ambulatory Visit: Payer: Self-pay

## 2020-04-29 ENCOUNTER — Ambulatory Visit: Payer: BC Managed Care – PPO | Admitting: Family Medicine

## 2020-04-29 ENCOUNTER — Encounter: Payer: Self-pay | Admitting: Family Medicine

## 2020-04-29 VITALS — BP 122/64 | HR 89 | Ht 72.0 in | Wt 279.0 lb

## 2020-04-29 DIAGNOSIS — R7309 Other abnormal glucose: Secondary | ICD-10-CM

## 2020-04-29 DIAGNOSIS — E6609 Other obesity due to excess calories: Secondary | ICD-10-CM

## 2020-04-29 DIAGNOSIS — R7303 Prediabetes: Secondary | ICD-10-CM | POA: Diagnosis not present

## 2020-04-29 DIAGNOSIS — F319 Bipolar disorder, unspecified: Secondary | ICD-10-CM | POA: Diagnosis not present

## 2020-04-29 DIAGNOSIS — Z6837 Body mass index (BMI) 37.0-37.9, adult: Secondary | ICD-10-CM

## 2020-04-29 LAB — POCT GLYCOSYLATED HEMOGLOBIN (HGB A1C): Hemoglobin A1C: 6.1 % — AB (ref 4.0–5.6)

## 2020-04-29 NOTE — Patient Instructions (Signed)
It was nice to meet you today,  Your A1c was 6.1.  It continues to go up each year.  The best thing to do to prevent this from continuing to go up is to continue to try to lose weight and also to limit your carbohydrate intake.  This includes juice, sodas, starches such as rice and fries and breads, and foods with sugar added to them such as desserts.  I will provide you with the information for Dr. Gerilyn Pilgrim, our nutritionist.  Bonita Quin can call her or email her to schedule an appointment.  I would like to see you back in 6 months.  Have a great day,  Frederic Jericho, MD   Diabetes Mellitus and Nutrition, Adult When you have diabetes (diabetes mellitus), it is very important to have healthy eating habits because your blood sugar (glucose) levels are greatly affected by what you eat and drink. Eating healthy foods in the appropriate amounts, at about the same times every day, can help you:  Control your blood glucose.  Lower your risk of heart disease.  Improve your blood pressure.  Reach or maintain a healthy weight. Every person with diabetes is different, and each person has different needs for a meal plan. Your health care provider may recommend that you work with a diet and nutrition specialist (dietitian) to make a meal plan that is best for you. Your meal plan may vary depending on factors such as:  The calories you need.  The medicines you take.  Your weight.  Your blood glucose, blood pressure, and cholesterol levels.  Your activity level.  Other health conditions you have, such as heart or kidney disease. How do carbohydrates affect me? Carbohydrates, also called carbs, affect your blood glucose level more than any other type of food. Eating carbs naturally raises the amount of glucose in your blood. Carb counting is a method for keeping track of how many carbs you eat. Counting carbs is important to keep your blood glucose at a healthy level, especially if you use insulin or take  certain oral diabetes medicines. It is important to know how many carbs you can safely have in each meal. This is different for every person. Your dietitian can help you calculate how many carbs you should have at each meal and for each snack. Foods that contain carbs include:  Bread, cereal, rice, pasta, and crackers.  Potatoes and corn.  Peas, beans, and lentils.  Milk and yogurt.  Fruit and juice.  Desserts, such as cakes, cookies, ice cream, and candy. How does alcohol affect me? Alcohol can cause a sudden decrease in blood glucose (hypoglycemia), especially if you use insulin or take certain oral diabetes medicines. Hypoglycemia can be a life-threatening condition. Symptoms of hypoglycemia (sleepiness, dizziness, and confusion) are similar to symptoms of having too much alcohol. If your health care provider says that alcohol is safe for you, follow these guidelines:  Limit alcohol intake to no more than 1 drink per day for nonpregnant women and 2 drinks per day for men. One drink equals 12 oz of beer, 5 oz of wine, or 1 oz of hard liquor.  Do not drink on an empty stomach.  Keep yourself hydrated with water, diet soda, or unsweetened iced tea.  Keep in mind that regular soda, juice, and other mixers may contain a lot of sugar and must be counted as carbs. What are tips for following this plan?  Reading food labels  Start by checking the serving size on the "  Nutrition Facts" label of packaged foods and drinks. The amount of calories, carbs, fats, and other nutrients listed on the label is based on one serving of the item. Many items contain more than one serving per package.  Check the total grams (g) of carbs in one serving. You can calculate the number of servings of carbs in one serving by dividing the total carbs by 15. For example, if a food has 30 g of total carbs, it would be equal to 2 servings of carbs.  Check the number of grams (g) of saturated and trans fats in one  serving. Choose foods that have low or no amount of these fats.  Check the number of milligrams (mg) of salt (sodium) in one serving. Most people should limit total sodium intake to less than 2,300 mg per day.  Always check the nutrition information of foods labeled as "low-fat" or "nonfat". These foods may be higher in added sugar or refined carbs and should be avoided.  Talk to your dietitian to identify your daily goals for nutrients listed on the label. Shopping  Avoid buying canned, premade, or processed foods. These foods tend to be high in fat, sodium, and added sugar.  Shop around the outside edge of the grocery store. This includes fresh fruits and vegetables, bulk grains, fresh meats, and fresh dairy. Cooking  Use low-heat cooking methods, such as baking, instead of high-heat cooking methods like deep frying.  Cook using healthy oils, such as olive, canola, or sunflower oil.  Avoid cooking with butter, cream, or high-fat meats. Meal planning  Eat meals and snacks regularly, preferably at the same times every day. Avoid going long periods of time without eating.  Eat foods high in fiber, such as fresh fruits, vegetables, beans, and whole grains. Talk to your dietitian about how many servings of carbs you can eat at each meal.  Eat 4-6 ounces (oz) of lean protein each day, such as lean meat, chicken, fish, eggs, or tofu. One oz of lean protein is equal to: ? 1 oz of meat, chicken, or fish. ? 1 egg. ?  cup of tofu.  Eat some foods each day that contain healthy fats, such as avocado, nuts, seeds, and fish. Lifestyle  Check your blood glucose regularly.  Exercise regularly as told by your health care provider. This may include: ? 150 minutes of moderate-intensity or vigorous-intensity exercise each week. This could be brisk walking, biking, or water aerobics. ? Stretching and doing strength exercises, such as yoga or weightlifting, at least 2 times a week.  Take medicines  as told by your health care provider.  Do not use any products that contain nicotine or tobacco, such as cigarettes and e-cigarettes. If you need help quitting, ask your health care provider.  Work with a Veterinary surgeon or diabetes educator to identify strategies to manage stress and any emotional and social challenges. Questions to ask a health care provider  Do I need to meet with a diabetes educator?  Do I need to meet with a dietitian?  What number can I call if I have questions?  When are the best times to check my blood glucose? Where to find more information:  American Diabetes Association: diabetes.org  Academy of Nutrition and Dietetics: www.eatright.[employer] of Diabetes and Digestive and Kidney Diseases (NIH): CarFlippers.tn Summary  A healthy meal plan will help you control your blood glucose and maintain a healthy lifestyle.  Working with a diet and nutrition specialist (dietitian) can help  you make a meal plan that is best for you.  Keep in mind that carbohydrates (carbs) and alcohol have immediate effects on your blood glucose levels. It is important to count carbs and to use alcohol carefully. This information is not intended to replace advice given to you by your health care provider. Make sure you discuss any questions you have with your health care provider. Document Revised: 08/12/2017 Document Reviewed: 10/04/2016 Elsevier Patient Education  2020 [employer].

## 2020-04-29 NOTE — Progress Notes (Signed)
    SUBJECTIVE:   CHIEF COMPLAINT / HPI:   Bipolar disorder: Patient is prescribed Lamictal and once monthly Abilify injections from Dr. Maggie Schwalbe.  He denies any recent manic or depressive episodes.  He is satisfied with his medication the current state of his bipolar disorder.  Obesity: Patient has currently using weight watchers with his mom.  He thinks it is helping somewhat.  We discussed his prediabetes and how his A1c is increasing.  We discussed which foods to avoid and which foods are okay.  We discussed exercise and weight loss in preventing further advancement to diabetes.  Patient just graduated [employer] school of journalism.  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  Currently not dating anyone.  Lives with his mother.  Likes to read and write into photography.   PERTINENT  PMH / PSH: Prediabetes, bipolar disorder  OBJECTIVE:   BP 122/64   Pulse 89   Ht 6' (1.829 m)   Wt 279 lb (126.6 kg)   SpO2 99%   BMI 37.84 kg/m   General: Alert, oriented.  No acute distress. HEENT: PERRLA, EOMI Neck: No cervical lymphadenopathy, acanthosis nigricans present CV: Regular rate and rhythm, no murmurs Pulmonary: Lungs clear to auscultation bilaterally Psych: Pleasant affect, makes eye contact, spontaneous speech  ASSESSMENT/PLAN:   Obesity Patient currently on weight watchers since March.  Encourage patient to continue dieting as well as discussed high sugar/carbohydrate foods to avoid and initiating an exercise routine.  Discussed consequences of continued obesity in regards to diabetes specifically. -Gave patient Dr. Gerilyn Pilgrim information, informed patient that she will be unavailable until late September. -Referral to nutritional therapy consultant.  Prediabetes A1c continues to increase.  Discussed with patient the long-term consequences of diabetes and how to avoid becoming diabetic with diet and exercise.  See plan above for referrals.  Bipolar 1 disorder  (HCC) Currently taking Lamictal and getting Abilify injection every 30 days.  Medications not managed by [place].  Both his PHQ and MDQ were negative.  Continue current plan.     Sandre Kitty, MD [facility]

## 2020-04-30 NOTE — Assessment & Plan Note (Signed)
A1c continues to increase.  Discussed with patient the long-term consequences of diabetes and how to avoid becoming diabetic with diet and exercise.  See plan above for referrals.

## 2020-04-30 NOTE — Assessment & Plan Note (Signed)
Currently taking Lamictal and getting Abilify injection every 30 days.  Medications not managed by [place].  Both his PHQ and MDQ were negative.  Continue current plan.

## 2020-04-30 NOTE — Assessment & Plan Note (Signed)
Patient currently on weight watchers since March.  Encourage patient to continue dieting as well as discussed high sugar/carbohydrate foods to avoid and initiating an exercise routine.  Discussed consequences of continued obesity in regards to diabetes specifically. -Gave patient Dr. Gerilyn Pilgrim information, informed patient that she will be unavailable until late September. -Referral to nutritional therapy consultant.

## 2020-06-25 ENCOUNTER — Encounter: Payer: BC Managed Care – PPO | Admitting: Dietician

## 2020-07-07 ENCOUNTER — Other Ambulatory Visit: Payer: Self-pay

## 2020-07-07 ENCOUNTER — Encounter: Payer: Self-pay | Admitting: Family Medicine

## 2020-07-07 ENCOUNTER — Ambulatory Visit (INDEPENDENT_AMBULATORY_CARE_PROVIDER_SITE_OTHER): Payer: BC Managed Care – PPO | Admitting: Family Medicine

## 2020-07-07 DIAGNOSIS — H9201 Otalgia, right ear: Secondary | ICD-10-CM | POA: Diagnosis not present

## 2020-07-07 NOTE — Patient Instructions (Addendum)
It was great to see you!  Our plans for today:  -Today we discussed your ear pain.  We also had your ears irrigated due to the wax buildup in them.  Unfortunately due to how impacted your ears were we were not able to remove the wax very well.  I would like for you to get some over-the-counter Debrox and use this to try to loosen the earwax.  You may want to make a follow-up appointment in the next week to try to have this removed if it is still present. -Because of your sore throat I would like for you to get a COVID-19 test. -If you develop shortness of breath, trouble breathing, high fevers, or other concerning symptoms not hesitate to call us or go to the emergency department.  Take care and seek immediate care sooner if you develop any concerns.   Dr. [facility]

## 2020-07-07 NOTE — Progress Notes (Signed)
    SUBJECTIVE:   CHIEF COMPLAINT / HPI:   Ear pain and sore throat: Patient is a 24 year old male that presents today to discuss ear pain. Right sided ear pain. Sore throat for 3 days. No cough, runny nose, shortness of breath, no sick contact. No vomiting or diarrhea, no fevers. No decreased hearing out of the right ear.  Patient states he has a history of needing his ears irrigated due to impacted cerumen  PERTINENT  PMH / PSH: History of impacted cerumen in both ears  OBJECTIVE:   BP 125/80   Pulse 82   Ht 6' (1.829 m)   Wt 281 lb (127.5 kg)   SpO2 98%   BMI 38.11 kg/m    General: NAD, pleasant, able to participate in exam HEENT: Impacted cerumen present in both ears, no erythema noted to the ear canal, no pharyngeal erythema, no nasal congestion, no cervical lymphadenopathy. Cardiac: RRR, no murmurs. Respiratory: CTAB, normal effort, No wheezes, rales or rhonchi  ASSESSMENT/PLAN:   Ear pain Assessment: 24 year old male with a history of need for irrigation for impacted cerumen presenting with ear pain worse in the right ear.  Physical exam significant for impacted cerumen present in both ears.  No erythema noted of the ear canal.  Patient also does endorse a sore throat which may be significant for a viral upper respiratory illness.  As this is in the midst of the COVID-19 pandemic do recommend patient get COVID-19 testing. Plan: -Irrigation performed in the office of the ear canals to remove the impacted cerumen, however this was unsuccessful and the patient still had impacted cerumen present in both ears on reevaluation. -Recommended patient get over-the-counter Debrox to try to loosen the impacted cerumen.  Recommended patient make a follow-up appointment next week to have reirrigation if he is not able to remove the earwax with the Debrox. -Recommend patient get COVID-19 testing due to the symptoms of sore throat -Return precautions provided.   Sore throat: Assessment  24 year old male with ear pain for 3 days and history of impacted cerumen with a sore throat for the past 3 days as well.  Denies cough, rhinorrhea, sick contacts, nausea, vomiting, diarrhea, fevers, shortness of breath.  States he does work for [employer].  Physical exam significant for no pharyngeal erythema, no lymphadenopathy, no congestion in the naris. Plan: -Discussed recommendation to have patient get COVID-19 testing -Provided return/ED precautions.  Kyle Poling, DO [facility]    This note was prepared using Dragon voice recognition software and may include unintentional dictation errors due to the inherent limitations of voice recognition software.

## 2020-07-07 NOTE — Assessment & Plan Note (Addendum)
Assessment: 24 year old male with a history of need for irrigation for impacted cerumen presenting with ear pain worse in the right ear.  Physical exam significant for impacted cerumen present in both ears.  No erythema noted of the ear canal.  Patient also does endorse a sore throat which may be significant for a viral upper respiratory illness.  As this is in the midst of the COVID-19 pandemic do recommend patient get COVID-19 testing. Plan: -Irrigation performed in the office of the ear canals to remove the impacted cerumen, however this was unsuccessful and the patient still had impacted cerumen present in both ears on reevaluation. -Recommended patient get over-the-counter Debrox to try to loosen the impacted cerumen.  Recommended patient make a follow-up appointment next week to have reirrigation if he is not able to remove the earwax with the Debrox. -Recommend patient get COVID-19 testing due to the symptoms of sore throat -Return precautions provided.

## 2020-07-17 ENCOUNTER — Encounter: Payer: Self-pay | Admitting: Skilled Nursing Facility1

## 2020-07-17 ENCOUNTER — Encounter: Payer: BC Managed Care – PPO | Attending: Family Medicine | Admitting: Skilled Nursing Facility1

## 2020-07-17 ENCOUNTER — Other Ambulatory Visit: Payer: Self-pay

## 2020-07-17 DIAGNOSIS — R7303 Prediabetes: Secondary | ICD-10-CM | POA: Diagnosis present

## 2020-07-17 NOTE — Progress Notes (Signed)
  Assessment:  Primary concerns today: prediabetes.   A1C 6.1  Pt states he sleeps about 6-8 hours each night. Pt states his stress is normal. Pt states he has hobbies like writing music photography. Pt states he has a regular bowel movement daily. Pt states he lives with his mom who is supportive.    MEDICATIONS: see list   DIETARY INTAKE:  Usual eating pattern includes 3 meals and 0 snacks per day.  Everyday foods include fast food.  Avoided foods include none stated.    24-hr recall:  B ( AM): fast food Snk ( AM):  L ( PM): fast food Snk ( PM):  D ( PM): spagetti or casserole or fast food Snk ( PM):  Beverages: juice, lemonade, soda, water  Usual physical activity: walking dog 1-2 times a day 30-45 minutes 5 days a week  Estimated energy needs: 2000 calories   Progress Towards Goal(s):  In progress.    Intervention:  Nutrition counseling. Dietitian educated pt on healthy diet within the context of prediabetes and weight loss. Dietitian impressed the importance of long term changes to ensure he is healthy for the rest of his life not just the next few months.  Importance of vegetables To have an overall healthy diet, adult men and women are recommended to consume anywhere from 2-3 cups of vegetables daily. Vegetables provide a wide range of vitamins and minerals such as vitamin A, vitamin C, potassium, and folic acid. According to the [employer], including fruit and vegetables daily may reduce the risk of cardiovascular disease, certain cancers, and other non-communicable diseases. Why you need complex carbohydrates: Whole grains and other complex carbohydrates are required to have a healthy diet. Whole grains provide fiber which can help with blood glucose levels and help keep you satiated. Fruits and starchy vegetables provide essential vitamins and minerals required for immune function, eyesight support, brain support, bone density, wound healing and many other  functions within the body. According to the current evidenced based 2020-2025 Dietary Guidelines for Americans, complex carbohydrates are part of a healthy eating pattern which is associated with a decreased risk for type 2 diabetes, cancers, and cardiovascular disease.   Goals: Do not purchase: juice, soda, lemonade  Diet drinks or alternative sweeteners are a great option to help transition to water   Aim for a minimum of 64 fluid ounces of water per day Aim to grocery shop online and just pick it up: shop about every 2 weeks Make larger portions for dinner to have for leftovers for lunch the next day Avoid eating fast food with consistency   Teaching Method Utilized:  Visual Auditory Hands on  Handouts given during visit include:  Detailed MyPlate  Barriers to learning/adherence to lifestyle change:   Demonstrated degree of understanding via:  Teach Back   Monitoring/Evaluation:  Dietary intake, exercise, and body weight prn.

## 2020-07-28 NOTE — Progress Notes (Signed)
    SUBJECTIVE:   CHIEF COMPLAINT / HPI:    Ear pain-follow-up: Patient is a 24 year old male that presents today for follow-up after previous appointment were the patient had ear pain and a sore throat.  At the previous appointment is recommended the patient get over-the-counter Debrox and clear out some of the cerumen which was present on physical exam of the patient's age.  Irrigation had been attempted in the office at that time but was unsuccessful.  Today patient states that the Debrox drops seem to make a big difference, his ear pain has resolved, however he still feels that he may have some earwax present.  PERTINENT  PMH / PSH: None relevant  OBJECTIVE:   BP 124/78   Pulse 82   Ht 6' (1.829 m)   Wt 282 lb (127.9 kg)   SpO2 98%   BMI 38.25 kg/m    General: NAD, pleasant, able to participate in exam HEENT: Significant cerumen present in bilateral ears, unable to visualize tympanic membranes.  Irrigation performed.  After irrigation was performed left tympanic membrane is visible with no erythema, right tympanic membrane continues to be obscured by cerumen. Psych: Normal affect and mood  ASSESSMENT/PLAN:   Impacted cerumen Assessment:  24 y.o. male with follow-up for ear pain with previous appointment showing impacted cerumen patient planning to use Debrox to clear out his cerumen.  After irrigation was performed today the left tympanic membrane is visible, however the right and continues to be obscured by cerumen.  Patient endorses no more pain in his ears and did get a significant amount of cerumen out of both the left and the right ear.  Patient request continue to use Debrox and follow-up in about 1 week. Plan: -We will follow-up in 1 week per patient request with him continue to use Debrox to soften the cerumen.  In 1 week will repeat irrigation if needed.  If still unable to remove the cerumen from the right ear we will consider an ENT referral.      Jackelyn Poling,  DO [facility]    This note was prepared using Dragon voice recognition software and may include unintentional dictation errors due to the inherent limitations of voice recognition software.

## 2020-07-28 NOTE — Patient Instructions (Signed)
It was great to see you!  Our plans for today:  -Today we performed an irrigation of your ears to remove the earwax.  The left ear was successful in having the earwax removed, however the right ear continues to have some earwax present.  I would like for you to continue using the Debrox and make a follow-up appointment for about 1 week where we can consider doing the irrigation again if there is still area wax in the way.  Take care and seek immediate care sooner if you develop any concerns.   Dr. [facility]

## 2020-07-29 ENCOUNTER — Ambulatory Visit (INDEPENDENT_AMBULATORY_CARE_PROVIDER_SITE_OTHER): Payer: BC Managed Care – PPO | Admitting: Family Medicine

## 2020-07-29 ENCOUNTER — Other Ambulatory Visit: Payer: Self-pay

## 2020-07-29 ENCOUNTER — Encounter: Payer: Self-pay | Admitting: Family Medicine

## 2020-07-29 DIAGNOSIS — H6123 Impacted cerumen, bilateral: Secondary | ICD-10-CM

## 2020-07-29 NOTE — Assessment & Plan Note (Signed)
Assessment:  24 y.o. male with follow-up for ear pain with previous appointment showing impacted cerumen patient planning to use Debrox to clear out his cerumen.  After irrigation was performed today the left tympanic membrane is visible, however the right and continues to be obscured by cerumen.  Patient endorses no more pain in his ears and did get a significant amount of cerumen out of both the left and the right ear.  Patient request continue to use Debrox and follow-up in about 1 week. Plan: -We will follow-up in 1 week per patient request with him continue to use Debrox to soften the cerumen.  In 1 week will repeat irrigation if needed.  If still unable to remove the cerumen from the right ear we will consider an ENT referral.

## 2020-07-30 NOTE — Patient Instructions (Signed)
It was great to see you!  Our plans for today:  -Your ears look significantly improved.  I do not see any impacted earwax remaining. -In the future if you develop any symptoms that make you think you may be getting earwax in your ear I would recommend using the Debrox early on so it is easier to irrigate the earwax out. -I hope you have a happy Thanksgiving! Follow-up as needed.  Take care and seek immediate care sooner if you develop any concerns.   Dr. [facility]

## 2020-08-04 NOTE — Progress Notes (Signed)
° ° °  SUBJECTIVE:   CHIEF COMPLAINT / HPI:   Impacted cerumen: Patient is a pleasant 24 year old male that presents today for follow-up with impacted cerumen.  During her previous appointment the patient received some irrigation was able to remove a large portion of his impacted cerumen, however some of it was unable to be removed.  The patient requested to take Debrox at home and follow-up in about 1 week for reirrigation with a goal of removing the remaining cerumen.  Patient states today that he did indeed use the Debrox and that his ears feel significantly better.  He denies any ear pain at this time.  Irrigation was performed with some cerumen removed in office today.  PERTINENT  PMH / PSH: None relevant  OBJECTIVE:   BP 122/60    Pulse 96    Ht 6' (1.829 m)    Wt 279 lb 8 oz (126.8 kg)    SpO2 98%    BMI 37.91 kg/m    General: NAD, pleasant, able to participate in exam HEENT:Tympanic membranes without erythema bilaterally, no impacted cerumen present in ear canal. Psych: Normal affect and mood  ASSESSMENT/PLAN:   Impacted cerumen Assessment: History of impacted cerumen with recent office visit where irrigation removed some of the cerumen but still had some impaction and patient was planning to use some Debrox and follow-up today for repeat irrigation.  Today cerumen is completely removed with both tympanic membranes visible.  There is no impacted cerumen visible in the ear canal.  Patient endorses no symptoms today and states he feels much better after using the Debrox and the irrigation. Plan: -Irrigation performed in office today as above -Follow-up as needed.   Jackelyn Poling, DO [facility]    This note was prepared using Dragon voice recognition software and may include unintentional dictation errors due to the inherent limitations of voice recognition software.

## 2020-08-05 ENCOUNTER — Other Ambulatory Visit: Payer: Self-pay

## 2020-08-05 ENCOUNTER — Ambulatory Visit (INDEPENDENT_AMBULATORY_CARE_PROVIDER_SITE_OTHER): Payer: BC Managed Care – PPO | Admitting: Family Medicine

## 2020-08-05 DIAGNOSIS — H6123 Impacted cerumen, bilateral: Secondary | ICD-10-CM | POA: Diagnosis not present

## 2020-08-05 NOTE — Assessment & Plan Note (Signed)
Assessment: History of impacted cerumen with recent office visit where irrigation removed some of the cerumen but still had some impaction and patient was planning to use some Debrox and follow-up today for repeat irrigation.  Today cerumen is completely removed with both tympanic membranes visible.  There is no impacted cerumen visible in the ear canal.  Patient endorses no symptoms today and states he feels much better after using the Debrox and the irrigation. Plan: -Irrigation performed in office today as above -Follow-up as needed.

## 2020-09-09 ENCOUNTER — Ambulatory Visit: Payer: BC Managed Care – PPO | Admitting: Family Medicine

## 2020-09-10 DIAGNOSIS — H60512 Acute actinic otitis externa, left ear: Secondary | ICD-10-CM | POA: Insufficient documentation

## 2020-09-10 DIAGNOSIS — H6123 Impacted cerumen, bilateral: Secondary | ICD-10-CM | POA: Insufficient documentation

## 2020-10-15 ENCOUNTER — Ambulatory Visit: Payer: BC Managed Care – PPO | Admitting: Podiatry

## 2020-10-16 ENCOUNTER — Other Ambulatory Visit: Payer: Self-pay

## 2020-10-16 ENCOUNTER — Encounter: Payer: Self-pay | Admitting: Podiatry

## 2020-10-16 ENCOUNTER — Ambulatory Visit (INDEPENDENT_AMBULATORY_CARE_PROVIDER_SITE_OTHER): Payer: BC Managed Care – PPO | Admitting: Podiatry

## 2020-10-16 DIAGNOSIS — L600 Ingrowing nail: Secondary | ICD-10-CM | POA: Diagnosis not present

## 2020-10-16 NOTE — Patient Instructions (Signed)

## 2020-10-17 NOTE — Progress Notes (Signed)
Subjective:   Patient ID: Kyle Mcmahon, male   DOB: 25 y.o.   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  I allowed him to sign consent form and today infiltrated 60 mg like Marcaine mixture sterile prep done using sterile instrumentation I remove the medial border exposed matrix applied phenol 3 applications 30 seconds followed by alcohol lavage sterile dressing gave instructions on soaks.  Patient will be seen back for [place] to recheck encouraged to call with questions concerns

## 2021-03-13 ENCOUNTER — Ambulatory Visit
Admission: EM | Admit: 2021-03-13 | Discharge: 2021-03-13 | Disposition: A | Discharge status: Home / Self Care | Payer: BC Managed Care – PPO | Attending: Family Medicine | Admitting: Family Medicine

## 2021-03-13 ENCOUNTER — Other Ambulatory Visit: Payer: Self-pay

## 2021-03-13 DIAGNOSIS — L02415 Cutaneous abscess of right lower limb: Secondary | ICD-10-CM | POA: Diagnosis not present

## 2021-03-13 MED ORDER — DOXYCYCLINE HYCLATE 100 MG PO CAPS: 100.0000 mg | ORAL_CAPSULE | Freq: Two times a day (BID) | ORAL | 0 refills | Status: DC

## 2021-03-13 NOTE — ED Provider Notes (Addendum)
[facility]   [id number] 03/13/21 Arrival Time: 0922  CC: RASH  SUBJECTIVE:  Kyle Mcmahon is a 25 y.o. male who presents with a skin complaint that began about 2 days ago. Reports abscess to right inner thigh. Reports hx of the same. Usually sees dermatology for this, but could not get in. Denies precipitating event or trauma. Denies changes in soaps, detergents, close contacts with similar rash, known trigger or environmental trigger, allergy. Denies medications change or starting a new medication recently. Describes it as red, warm, painful and draining yellow/white fluid. Has tried warm compresses without relief. There are no aggravating or alleviating factors. Denies similar symptoms in the past that improved with I&D.  Denies fever, chills, nausea, vomiting, oral lesions, SOB, chest pain, abdominal pain, changes in bowel or bladder function.    ROS: As per HPI.  All other pertinent ROS negative.     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   Current Outpatient Medications on File Prior to Encounter  Medication Sig Dispense Refill   ABILIFY MAINTENA 300 MG PRSY prefilled syringe Inject into the muscle every 30 (thirty) days.     baclofen (LIORESAL) 10 MG tablet Take 0.5-1 tablets (5-10 mg total) by mouth 2 (two) times daily as needed for muscle spasms. 30 each 0   hydrOXYzine (ATARAX/VISTARIL) 50 MG tablet Take by mouth.     lamoTRIgine (LAMICTAL) 150 MG tablet Take 150 mg by mouth 2 (two) times daily.     metFORMIN (GLUCOPHAGE) 500 MG tablet      Social History   Socioeconomic History   Marital status: Single    Spouse name: Not on file   Number of children: Not on file   Years of education: Not on  file   Highest education level: Not on file  Occupational History   Not on file  Tobacco Use   Smoking status: Never   Smokeless tobacco: Never  Substance and Sexual Activity   Alcohol use: Yes    Comment: 3-4 drinks 2-4 x per month   Drug use: Not Currently    Comment: Pt endorsed Marijuana use; UDS was clear   Sexual activity: Never    Partners: Male  Other Topics Concern   Not on file  Social History Narrative   Lives with mother sister and maternal aunt.       Never sexually active. Partner preference: male.    Social Determinants of Health   Financial Resource Strain: Not on file  Food Insecurity: Not on file  Transportation Needs: Not on file  Physical Activity: Not on file  Stress: Not on file  Social Connections: Not on file  Intimate Partner Violence: Not on file   Family History  Problem Relation Age of Onset   Bipolar disorder Mother    Diabetes Mellitus II Paternal Grandfather    Diabetes Maternal Aunt     OBJECTIVE: Vitals:   03/13/21 0938  BP: 131/86  Pulse: (!) 109  Resp: 16  Temp: 99 F (37.2 C)  TempSrc: Oral  SpO2: 96%    General appearance: alert; no distress Head: NCAT Lungs: clear to auscultation bilaterally Heart: regular rate and rhythm.  Radial pulse 2+ bilaterally Extremities: no edema Skin: warm and dry; 2cm diameter area of erythema, swelling, heat, tenderness to the right upper, inner thigh Psychological: alert and cooperative; normal mood and affect  ASSESSMENT & PLAN:  1. Cutaneous abscess of right lower extremity     Meds ordered this encounter  Medications   doxycycline (VIBRAMYCIN) 100 MG capsule    Sig: Take 1 capsule (100 mg total) by mouth 2 (two) times daily.    Dispense:  14 capsule    Refill:  0    Order Specific Question:   Supervising Provider    Answer:   Merrilee Jansky [id number]    I&D in office today with good success Moderate amount purulent fluid expressed Straight stab incision with 11 blade  scalpel 1% lidocaine without epi used Tolerated well Prescribed doxycycline 100mg  BID x 7 days. Take as prescribed and to completion Avoid hot showers/ baths Moisturize skin daily  Follow up with PCP if symptoms persists Return or go to the ER if you have any new or worsening symptoms such as fever, chills, nausea, vomiting, redness, swelling, discharge, if symptoms do not improve with medications  Reviewed expectations re: course of current medical issues. Questions answered. Outlined signs and symptoms indicating need for more acute intervention. Patient verbalized understanding. After Visit Summary given.    , NP 03/13/21 1010    05/14/21, NP 03/13/21 1012

## 2021-03-13 NOTE — ED Triage Notes (Signed)
Pt present an abscess on his right inner thigh, pt states he noticed it three days ago. Pt states it painful and warm to the touch.

## 2021-03-13 NOTE — Discharge Instructions (Addendum)
I have sent in doxycycline for you to take one tablet twice a day for 7 days  Follow up with this office or with primary care for increased swelling, redness, tenderness, warmth, drainage from the area.  Follow up in the ER for red streaking up your leg, high fever, trouble swallowing, trouble breathing, other concerning symptoms.

## 2021-04-26 NOTE — Progress Notes (Signed)
    SUBJECTIVE:   CHIEF COMPLAINT / HPI: STI testing   No acute concerns today  STI testing Last sexually active in July 2 male partners in past 6 months, 5 male partners in past 1 year Uses condoms Denies any penile lesions or pain, urethral discharge, or dysuria Thinking about PrEP  Prediabetes Denies any polyuria, polydipsia, chest pain, abdominal pain, nausea or vomiting. Compliant with Metformin 500 mg daily.  Last A1c 6.1 (08/21)  PERTINENT  PMH / PSH:  Elevated BMI Prediabetes  OBJECTIVE:   BP 110/66   Pulse 80   Ht 6' (1.829 m)   Wt 286 lb 6 oz (129.9 kg)   SpO2 98%   BMI 38.84 kg/m    General: Alert, no acute distress Cardio: Normal S1 and S2, RRR, no r/m/g Pulm: CTAB, normal work of breathing Abdomen: Bowel sounds normal. Abdomen soft and non-tender.  Extremities: No peripheral edema.   ASSESSMENT/PLAN:   Routine screening for STI (sexually transmitted infection) Considering PrEP therapy.   -Urine GC/G -HIV, RPR, Hep C today -PrEP education provided -Follow up 1-2 weeks if wanting to start PrEP therapy, likely will initiate Truvada.  -Encouraged safe sex practices  Prediabetes Last A1c 6.1 (08/21) Asymptomatic and compliant with Metformin. -A1c, CMet, Lipid panel today -Continue Metformin 500 mg daily -Recommend lifestyle changes -Follow up with lab results   Obesity Interested in weight loss program. Had previously seen nutritionist.  Made some changes but did not stick to it.  Discussed medical weight loss management program.  Patient interested in starting program. -Refer to Weight management clinic -Encourage lifestyle changes -Follow up as needed     Kyle Allan, MD [facility]

## 2021-05-06 ENCOUNTER — Other Ambulatory Visit: Payer: Self-pay

## 2021-05-06 ENCOUNTER — Ambulatory Visit (INDEPENDENT_AMBULATORY_CARE_PROVIDER_SITE_OTHER): Payer: BC Managed Care – PPO | Admitting: Family Medicine

## 2021-05-06 ENCOUNTER — Other Ambulatory Visit (HOSPITAL_COMMUNITY)
Admission: RE | Admit: 2021-05-06 | Discharge: 2021-05-06 | Disposition: A | Discharge status: Home / Self Care | Payer: BC Managed Care – PPO | Source: Ambulatory Visit | Attending: Family Medicine | Admitting: Family Medicine

## 2021-05-06 VITALS — BP 110/66 | HR 80 | Ht 72.0 in | Wt 286.4 lb

## 2021-05-06 DIAGNOSIS — E6609 Other obesity due to excess calories: Secondary | ICD-10-CM

## 2021-05-06 DIAGNOSIS — Z113 Encounter for screening for infections with a predominantly sexual mode of transmission: Secondary | ICD-10-CM | POA: Insufficient documentation

## 2021-05-06 DIAGNOSIS — Z6838 Body mass index (BMI) 38.0-38.9, adult: Secondary | ICD-10-CM | POA: Diagnosis not present

## 2021-05-06 DIAGNOSIS — R7303 Prediabetes: Secondary | ICD-10-CM

## 2021-05-06 NOTE — Patient Instructions (Addendum)
Thank you for coming to see me today. It was a pleasure.   We will get some labs today.  If they are abnormal or we need to do something about them, I will call you.  If they are normal, I will send you a message on MyChart (if it is active) or a letter in the mail.  If you don't hear from [place] in 2 weeks, please call the office at the number below.   [facility]  [address] [place], [place], [place] [address] Hours:  Open ? Closes 6PM Phone: [phone]  Record your blood pressure two to three times a week and bring to your next visit  I recommend you consider PrEP treatment. Please review information and if you have any questions we can discuss at your next  Visit [email or web address]  Please follow-up with PCP in 2 weeks  If you have any questions or concerns, please do not hesitate to call the office at [phone].  Best,   Dana Allan, MD    Diabetes Mellitus and Nutrition, Adult When you have diabetes, or diabetes mellitus, it is very important to have healthy eating habits because your blood sugar (glucose) levels are greatly affected by what you eat and drink. Eating healthy foods in the right amounts, at about the same times every day, can help you: Control your blood glucose. Lower your risk of heart disease. Improve your blood pressure. Reach or maintain a healthy weight. What can affect my meal plan? Every person with diabetes is different, and each person has different needs for a meal plan. Your health care provider may recommend that you work with a dietitian to make a meal plan that is best for you. Your meal plan may vary depending on factors such as: The calories you need. The medicines you take. Your weight. Your blood glucose, blood pressure, and cholesterol levels. Your activity level. Other health conditions you have, such as heart or kidney disease. How do carbohydrates affect me? Carbohydrates, also  called carbs, affect your blood glucose level more than any other type of food. Eating carbs naturally raises the amount of glucose in your blood. Carb counting is a method for keeping track of how many carbs you eat. Counting carbs is important to keep your blood glucose at a healthy level,especially if you use insulin or take certain oral diabetes medicines. It is important to know how many carbs you can safely have in each meal. This is different for every person. Your dietitian can help you calculate how manycarbs you should have at each meal and for each snack. How does alcohol affect me? Alcohol can cause a sudden decrease in blood glucose (hypoglycemia), especially if you use insulin or take certain oral diabetes medicines. Hypoglycemia can be a life-threatening condition. Symptoms of hypoglycemia, such as sleepiness, dizziness, and confusion, are similar to symptoms of having too much alcohol. Do not drink alcohol if: Your health care provider tells you not to drink. You are pregnant, may be pregnant, or are planning to become pregnant. If you drink alcohol: Do not drink on an empty stomach. Limit how much you use to: 0-1 drink a day for women. 0-2 drinks a day for men. Be aware of how much alcohol is in your drink. In the [place] one drink equals one 12 oz bottle of beer (355 mL), one 5 oz glass of wine (148 mL), or one 1 oz glass of hard liquor (44 mL). Keep yourself hydrated with water,  diet soda, or unsweetened iced tea. Keep in mind that regular soda, juice, and other mixers may contain a lot of sugar and must be counted as carbs. What are tips for following this plan?  Reading food labels Start by checking the serving size on the "Nutrition Facts" label of packaged foods and drinks. The amount of calories, carbs, fats, and other nutrients listed on the label is based on one serving of the item. Many items contain more than one serving per package. Check the total grams (g) of carbs in  one serving. You can calculate the number of servings of carbs in one serving by dividing the total carbs by 15. For example, if a food has 30 g of total carbs per serving, it would be equal to 2 servings of carbs. Check the number of grams (g) of saturated fats and trans fats in one serving. Choose foods that have a low amount or none of these fats. Check the number of milligrams (mg) of salt (sodium) in one serving. Most people should limit total sodium intake to less than 2,300 mg per day. Always check the nutrition information of foods labeled as "low-fat" or "nonfat." These foods may be higher in added sugar or refined carbs and should be avoided. Talk to your dietitian to identify your daily goals for nutrients listed on the label. Shopping Avoid buying canned, pre-made, or processed foods. These foods tend to be high in fat, sodium, and added sugar. Shop around the outside edge of the grocery store. This is where you will most often find fresh fruits and vegetables, bulk grains, fresh meats, and fresh dairy. Cooking Use low-heat cooking methods, such as baking, instead of high-heat cooking methods like deep frying. Cook using healthy oils, such as olive, canola, or sunflower oil. Avoid cooking with butter, cream, or high-fat meats. Meal planning Eat meals and snacks regularly, preferably at the same times every day. Avoid going long periods of time without eating. Eat foods that are high in fiber, such as fresh fruits, vegetables, beans, and whole grains. Talk with your dietitian about how many servings of carbs you can eat at each meal. Eat 4-6 oz (112-168 g) of lean protein each day, such as lean meat, chicken, fish, eggs, or tofu. One ounce (oz) of lean protein is equal to: 1 oz (28 g) of meat, chicken, or fish. 1 egg.  cup (62 g) of tofu. Eat some foods each day that contain healthy fats, such as avocado, nuts, seeds, and fish. What foods should I eat? Fruits Berries. Apples.  Oranges. Peaches. Apricots. Plums. Grapes. Mango. Papaya.Pomegranate. Kiwi. Cherries. Vegetables Lettuce. Spinach. Leafy greens, including kale, chard, collard greens, and mustard greens. Beets. Cauliflower. Cabbage. Broccoli. Carrots. Green beans.Tomatoes. Peppers. Onions. Cucumbers. Brussels sprouts. Grains Whole grains, such as whole-wheat or whole-grain bread, crackers, tortillas,cereal, and pasta. Unsweetened oatmeal. Quinoa. Brown or wild rice. Meats and other proteins Seafood. Poultry without skin. Lean cuts of poultry and beef. Tofu. Nuts. Seeds. Dairy Low-fat or fat-free dairy products such as milk, yogurt, and cheese. The items listed above may not be a complete list of foods and beverages you can eat. Contact a dietitian for more information. What foods should I avoid? Fruits Fruits canned with syrup. Vegetables Canned vegetables. Frozen vegetables with butter or cream sauce. Grains Refined white flour and flour products such as bread, pasta, snack foods, andcereals. Avoid all processed foods. Meats and other proteins Fatty cuts of meat. Poultry with skin. Breaded or fried meats. Processed meat.Avoid  saturated fats. Dairy Full-fat yogurt, cheese, or milk. Beverages Sweetened drinks, such as soda or iced tea. The items listed above may not be a complete list of foods and beverages you should avoid. Contact a dietitian for more information. Questions to ask a health care provider Do I need to meet with a diabetes educator? Do I need to meet with a dietitian? What number can I call if I have questions? When are the best times to check my blood glucose? Where to find more information: American Diabetes Association: diabetes.org Academy of Nutrition and Dietetics: www.eatright.[employer] of Diabetes and Digestive and Kidney Diseases: CarFlippers.tn Association of Diabetes Care and Education Specialists: www.diabeteseducator.org Summary It is important to have  healthy eating habits because your blood sugar (glucose) levels are greatly affected by what you eat and drink. A healthy meal plan will help you control your blood glucose and maintain a healthy lifestyle. Your health care provider may recommend that you work with a dietitian to make a meal plan that is best for you. Keep in mind that carbohydrates (carbs) and alcohol have immediate effects on your blood glucose levels. It is important to count carbs and to use alcohol carefully. This information is not intended to replace advice given to you by your health care provider. Make sure you discuss any questions you have with your healthcare provider. Document Revised: 08/07/2019 Document Reviewed: 08/07/2019 Elsevier Patient Education  2021 [employer].

## 2021-05-07 ENCOUNTER — Encounter: Payer: Self-pay | Admitting: Family Medicine

## 2021-05-07 DIAGNOSIS — Z113 Encounter for screening for infections with a predominantly sexual mode of transmission: Secondary | ICD-10-CM

## 2021-05-07 HISTORY — DX: Encounter for screening for infections with a predominantly sexual mode of transmission: Z11.3

## 2021-05-07 LAB — LIPID PANEL
Chol/HDL Ratio: 4 ratio (ref 0.0–5.0)
Cholesterol, Total: 174 mg/dL (ref 100–199)
HDL: 44 mg/dL (ref >39)
LDL Chol Calc (NIH): 112 mg/dL — ABNORMAL HIGH (ref 0–99)
Triglycerides: 95 mg/dL (ref 0–149)
VLDL Cholesterol Cal: 18 mg/dL (ref 5–40)

## 2021-05-07 LAB — CBC WITH DIFFERENTIAL/PLATELET
Basophils Absolute: 0.1 10*3/uL (ref 0.0–0.2)
Basos: 1 %
EOS (ABSOLUTE): 0 10*3/uL (ref 0.0–0.4)
Eos: 0 %
Hematocrit: 45.7 % (ref 37.5–51.0)
Hemoglobin: 15 g/dL (ref 13.0–17.7)
Immature Grans (Abs): 0 10*3/uL (ref 0.0–0.1)
Immature Granulocytes: 0 %
Lymphocytes Absolute: 2.9 10*3/uL (ref 0.7–3.1)
Lymphs: 26 %
MCH: 26.4 pg — ABNORMAL LOW (ref 26.6–33.0)
MCHC: 32.8 g/dL (ref 31.5–35.7)
MCV: 81 fL (ref 79–97)
Monocytes Absolute: 0.7 10*3/uL (ref 0.1–0.9)
Monocytes: 6 %
Neutrophils Absolute: 7.3 10*3/uL — ABNORMAL HIGH (ref 1.4–7.0)
Neutrophils: 67 %
Platelets: 356 10*3/uL (ref 150–450)
RBC: 5.68 x10E6/uL (ref 4.14–5.80)
RDW: 13.8 % (ref 11.6–15.4)
WBC: 11 10*3/uL — ABNORMAL HIGH (ref 3.4–10.8)

## 2021-05-07 LAB — COMPREHENSIVE METABOLIC PANEL
ALT: 31 IU/L (ref 0–44)
AST: 21 IU/L (ref 0–40)
Albumin/Globulin Ratio: 1.8 (ref 1.2–2.2)
Albumin: 4.9 g/dL (ref 4.1–5.2)
Alkaline Phosphatase: 62 IU/L (ref 44–121)
BUN/Creatinine Ratio: 16 (ref 9–20)
BUN: 16 mg/dL (ref 6–20)
Bilirubin Total: 0.5 mg/dL (ref 0.0–1.2)
CO2: 22 mmol/L (ref 20–29)
Calcium: 10.1 mg/dL (ref 8.7–10.2)
Chloride: 98 mmol/L (ref 96–106)
Creatinine, Ser: 0.97 mg/dL (ref 0.76–1.27)
Globulin, Total: 2.7 g/dL (ref 1.5–4.5)
Glucose: 113 mg/dL — ABNORMAL HIGH (ref 65–99)
Potassium: 4.1 mmol/L (ref 3.5–5.2)
Sodium: 138 mmol/L (ref 134–144)
Total Protein: 7.6 g/dL (ref 6.0–8.5)
eGFR: 112 mL/min/{1.73_m2} (ref >59)

## 2021-05-07 LAB — URINE CYTOLOGY ANCILLARY ONLY
Chlamydia: NEGATIVE
Comment: NEGATIVE
Comment: NEGATIVE
Comment: NORMAL
Neisseria Gonorrhea: NEGATIVE
Trichomonas: NEGATIVE

## 2021-05-07 LAB — HCV AB W REFLEX TO QUANT PCR: HCV Ab: <0.1 s/co ratio (ref 0.0–0.9)

## 2021-05-07 LAB — RPR: RPR Ser Ql: NONREACTIVE

## 2021-05-07 LAB — HCV INTERPRETATION

## 2021-05-07 LAB — HIV ANTIBODY (ROUTINE TESTING W REFLEX): HIV Screen 4th Generation wRfx: NONREACTIVE

## 2021-05-07 NOTE — Addendum Note (Signed)
Addended by: Jennette Bill on: 05/07/2021 03:50 PM   Modules accepted: Orders

## 2021-05-07 NOTE — Assessment & Plan Note (Signed)
Last A1c 6.1 (08/21) Asymptomatic and compliant with Metformin. -A1c, CMet, Lipid panel today -Continue Metformin 500 mg daily -Recommend lifestyle changes -Follow up with lab results

## 2021-05-07 NOTE — Assessment & Plan Note (Addendum)
Considering PrEP therapy.   -Urine GC/G -HIV, RPR, Hep C today -PrEP education provided -Follow up 1-2 weeks if wanting to start PrEP therapy, likely will initiate Truvada.  -Encouraged safe sex practices

## 2021-05-07 NOTE — Assessment & Plan Note (Signed)
Interested in weight loss program. Had previously seen nutritionist.  Made some changes but did not stick to it.  Discussed medical weight loss management program.  Patient interested in starting program. -Refer to Weight management clinic -Encourage lifestyle changes -Follow up as needed

## 2021-05-09 LAB — HEMOGLOBIN A1C
Est. average glucose Bld gHb Est-mCnc: 137 mg/dL
Hgb A1c MFr Bld: 6.4 % — ABNORMAL HIGH (ref 4.8–5.6)

## 2021-05-09 LAB — SPECIMEN STATUS REPORT

## 2021-05-13 ENCOUNTER — Other Ambulatory Visit: Payer: Self-pay | Admitting: Family Medicine

## 2021-05-13 MED ORDER — METFORMIN HCL 500 MG PO TABS: 500.0000 mg | ORAL_TABLET | Freq: Two times a day (BID) | ORAL | Status: DC

## 2021-05-13 NOTE — Progress Notes (Signed)
Called patient to discuss results of A1c.  Has elevated to 6.4.  Will increase Metformin to 500 mg BID Repeat A1c in 3 months  Dana Allan, MD [facility]

## 2021-05-22 ENCOUNTER — Ambulatory Visit (INDEPENDENT_AMBULATORY_CARE_PROVIDER_SITE_OTHER): Payer: Self-pay

## 2021-05-22 ENCOUNTER — Other Ambulatory Visit: Payer: Self-pay

## 2021-05-22 DIAGNOSIS — Z23 Encounter for immunization: Secondary | ICD-10-CM

## 2021-05-22 NOTE — Progress Notes (Signed)
    [facility]  Name:  LAVONE WEISEL    MRN: [id number] DOB: [birth year]   05/22/2021  Mr. Radigan was observed post JYNNEOS immunization for 15 minutes without incident. He was provided with Vaccine Information Sheet and instruction to access the V-Safe system.   Mr. Rohr was instructed to call 911 with any severe reactions post vaccine: Difficulty breathing  Swelling of face and throat  A fast heartbeat  A bad rash all over body  Dizziness and weakness

## 2021-05-24 NOTE — Progress Notes (Signed)
    SUBJECTIVE:   CHIEF COMPLAINT / HPI:   Presents for follow up for prediabetes check up. Seen in clinic on 05/06/21 and had one elevated blood pressure reading.  Since then patient reports average blood pressure readings of 118/75 from daily measurements. No indication for starting medications today.  Prediabetes  - HbA1C was 6.4 on 05/06/21. Tolerating increase in 500mg  Metformin BID from last visit.  Asymptomatic.    Health Maintenance - Received Monekeypox vaccine last week.  Weight Loss - Inquired as to when he would be contacted about weight loss referral initiated last visit. Has started weight watchers and is going to the gym regularly with friends at .  prEp consideration - Has not had a chance to review prEp materials provided last visit. Will review and reach out when he has made a decision on whether he wants to pursue therapy.    PERTINENT  PMH / PSH:  Prediabetes  OBJECTIVE:   BP 100/68   Pulse (!) 103   Ht 6' (1.829 m)   Wt 287 lb (130.2 kg)   SpO2 98%   BMI 38.92 kg/m    General: Alert, no acute distress Cardio: Normal S1 and S2, RRR, no r/m/g Pulm: CTAB, normal work of breathing Abdomen: Bowel sounds normal. Abdomen soft and non-tender.  Extremities: No peripheral edema.  Neuro: Cranial nerves grossly intact   ASSESSMENT/PLAN:   Prediabetes Tolerating increased dose of metformin BID. No complaints. Working on weight loss to lower HbA1C. - Follow up on weight loss program at next visit  Obesity Started weight watchers program. Exercising regularly at [employer] with friends.  Routine screening for STI (sexually transmitted infection) Alexy will review prEp patient information provided in AVS. Will reach out via phone or MyChart once he has decided. - Will get HIV, HepB and HepC labs before initiating Truvada (orders pended)   Health Maintenance Moneypox vaccine received last week Flu vaccine given today  [employer], MD [facility]  [facility]

## 2021-05-26 ENCOUNTER — Encounter: Payer: Self-pay | Admitting: Family Medicine

## 2021-05-26 ENCOUNTER — Other Ambulatory Visit: Payer: Self-pay

## 2021-05-26 ENCOUNTER — Ambulatory Visit: Payer: BC Managed Care – PPO | Admitting: Family Medicine

## 2021-05-26 VITALS — BP 100/68 | HR 103 | Ht 72.0 in | Wt 287.0 lb

## 2021-05-26 DIAGNOSIS — E6609 Other obesity due to excess calories: Secondary | ICD-10-CM

## 2021-05-26 DIAGNOSIS — Z113 Encounter for screening for infections with a predominantly sexual mode of transmission: Secondary | ICD-10-CM

## 2021-05-26 DIAGNOSIS — Z23 Encounter for immunization: Secondary | ICD-10-CM

## 2021-05-26 DIAGNOSIS — R7303 Prediabetes: Secondary | ICD-10-CM

## 2021-05-26 DIAGNOSIS — Z79899 Other long term (current) drug therapy: Secondary | ICD-10-CM

## 2021-05-26 DIAGNOSIS — Z6838 Body mass index (BMI) 38.0-38.9, adult: Secondary | ICD-10-CM

## 2021-05-26 NOTE — Assessment & Plan Note (Signed)
Tolerating increased dose of metformin BID. No complaints. Working on weight loss to lower HbA1C. - Follow up on weight loss program at next visit

## 2021-05-26 NOTE — Assessment & Plan Note (Signed)
Started weight watchers program. Exercising regularly at [employer] with friends.

## 2021-05-26 NOTE — Patient Instructions (Signed)
Thank you for coming to see me today. It was a pleasure.   Consider prEp treatment daily.  Use the CDC resource [email or web address]  MyChart me if you decide you would like to start medication.  You will need to be have some blood work done prior to starting medication.   Please follow-up with PCP 2- 3 months for diabetic exam  If you have any questions or concerns, please do not hesitate to call the office at [phone].  Best,   Dana Allan, MD

## 2021-05-26 NOTE — Assessment & Plan Note (Signed)
Kyle Mcmahon will review prEp patient information provided in AVS. Will reach out via phone or MyChart once he has decided. - Will get HIV, HepB and HepC labs before initiating Truvada (orders pended)

## 2021-07-03 ENCOUNTER — Ambulatory Visit: Payer: Self-pay

## 2021-07-24 ENCOUNTER — Telehealth: Payer: Self-pay

## 2021-07-24 NOTE — Telephone Encounter (Signed)
Outbound call to patient. Left HIPPA compliant voice message to contact our office. Kyle Mcmahon

## 2021-07-31 DIAGNOSIS — J029 Acute pharyngitis, unspecified: Secondary | ICD-10-CM | POA: Diagnosis not present

## 2021-07-31 DIAGNOSIS — J80 Acute respiratory distress syndrome: Secondary | ICD-10-CM | POA: Diagnosis not present

## 2021-07-31 DIAGNOSIS — J309 Allergic rhinitis, unspecified: Secondary | ICD-10-CM | POA: Diagnosis not present

## 2021-08-02 DIAGNOSIS — H6121 Impacted cerumen, right ear: Secondary | ICD-10-CM | POA: Diagnosis not present

## 2021-08-02 DIAGNOSIS — J020 Streptococcal pharyngitis: Secondary | ICD-10-CM | POA: Diagnosis not present

## 2021-08-04 ENCOUNTER — Ambulatory Visit: Payer: Self-pay | Admitting: Family Medicine

## 2021-08-05 ENCOUNTER — Ambulatory Visit: Payer: Self-pay

## 2021-08-20 DIAGNOSIS — F319 Bipolar disorder, unspecified: Secondary | ICD-10-CM | POA: Insufficient documentation

## 2021-08-20 DIAGNOSIS — J020 Streptococcal pharyngitis: Secondary | ICD-10-CM | POA: Diagnosis not present

## 2021-08-20 DIAGNOSIS — J029 Acute pharyngitis, unspecified: Secondary | ICD-10-CM | POA: Diagnosis not present

## 2021-08-23 DIAGNOSIS — J020 Streptococcal pharyngitis: Secondary | ICD-10-CM | POA: Diagnosis not present

## 2021-08-23 DIAGNOSIS — J029 Acute pharyngitis, unspecified: Secondary | ICD-10-CM | POA: Diagnosis not present

## 2021-09-08 ENCOUNTER — Ambulatory Visit (INDEPENDENT_AMBULATORY_CARE_PROVIDER_SITE_OTHER): Payer: BC Managed Care – PPO | Admitting: Family Medicine

## 2021-09-08 ENCOUNTER — Other Ambulatory Visit: Payer: Self-pay

## 2021-09-08 DIAGNOSIS — H6981 Other specified disorders of Eustachian tube, right ear: Secondary | ICD-10-CM

## 2021-09-08 DIAGNOSIS — H6991 Unspecified Eustachian tube disorder, right ear: Secondary | ICD-10-CM | POA: Insufficient documentation

## 2021-09-08 HISTORY — DX: Other specified disorders of eustachian tube, right ear: H69.81

## 2021-09-08 HISTORY — DX: Unspecified eustachian tube disorder, right ear: H69.91

## 2021-09-08 MED ORDER — FLUTICASONE PROPIONATE 50 MCG/ACT NA SUSP: 2.0000 | Freq: Every day | NASAL | 6 refills | Status: DC

## 2021-09-08 MED ORDER — OXYMETAZOLINE HCL 0.05 % NA SOLN: 1.0000 | Freq: Two times a day (BID) | NASAL | 0 refills | Status: DC

## 2021-09-08 NOTE — Progress Notes (Signed)
° ° °  SUBJECTIVE:   CHIEF COMPLAINT / HPI:   Right Ear Ache  Kyle Mcmahon is a 25 y.o. male who presents to the clinic today to discuss right ear pain since yesterday.  Patient reports that his symptoms started with congestion about 1 week ago.  Yesterday, his right ear began to hurt.  He has pain that radiates to the right side of his throat. Notes difficulty and pain turning head to the right.  He has tried Mucinex and saline nasal sprays without relief.  Denies fever, rhinorrhea, cough, headache, hearing problems, facial pain.  He feels that he has mucus in his nose/throat.  Patient tells me that he had 2 recent strep throat infections.  The first was in November in which she was given amoxicillin.  States that it came back earlier this month and he was given a 10-day course of Augmentin which cleared it up.  He also had testing done for mono, chlamydia/gonorrhea, HIV which were all negative.  His CBC at [facility] urgent care in early December showed white blood cell count of 9.9, otherwise unremarkable.  PERTINENT  PMH / PSH:  Past Medical History:  Diagnosis Date   Bipolar 1 disorder (HCC) 2019   Depression     OBJECTIVE:   BP 112/80    Pulse 97    Temp (!) 97.3 F (36.3 C)    Ht 6' (1.829 m)    Wt 283 lb 6.4 oz (128.5 kg)    SpO2 98%    BMI 38.44 kg/m    General: NAD, pleasant, able to participate in exam HEENT: Normocephalic, left TM impacted with cerumen, right TM retracted with dry black cerumen at edges. TMJ normal. Tragus non-tender b/l., oropharynx clear without erythema or exudates Neck: 2 right small (0.5cm) anterior cervical lymph nodes slightly tender to palpation Respiratory: Breathing comfortably on room air Extremities: no edema or cyanosis. Skin: warm and dry, no rashes noted Psych: Normal affect and mood  ASSESSMENT/PLAN:   Eustachian tube dysfunction, right Examination notable for right Eustachian tube dysfunction. Likely triggered from viral infection given  congestion proceeding symptoms. -Continue conservative measures: saline nasal sprays, Neti Pot, Vicks vapor rub, etc.  -Flonase sent to pharmacy. Instructed on proper use -3-day course of Afrin prescribed -Valsalva maneuvers discussed -Return precautions given     Sabino Dick, DO [facility]

## 2021-09-08 NOTE — Patient Instructions (Addendum)
It was wonderful to see you today.  Please bring ALL of your medications with you to every visit.   Today we talked about:  -You likely have a Eustachian tube dysfunction.  -I am sending Flonase to your pharmacy, use two sprays in each nostril daily -I am sending Afrin to your pharmacy, use two sprays daily for 3 days. -Cover your nose and mouth and blow out- this can help to regulate the pressure. -Return if worsening or if no improvement.   Thank you for choosing [facility].   Please call [phone] with any questions about today's appointment.  Please be sure to schedule follow up at the front  desk before you leave today.   Sabino Dick, DO PGY-2 Family Medicine    Eustachian Tube Dysfunction Eustachian tube dysfunction refers to a condition in which a blockage develops in the narrow passage that connects the middle ear to the back of the nose (eustachian tube). The eustachian tube regulates air pressure in the middle ear by letting air move between the ear and nose. It also helps to drain fluid from the middle ear space. Eustachian tube dysfunction can affect one or both ears. When the eustachian tube does not function properly, air pressure, fluid, or both can build up in the middle ear. What are the causes? This condition occurs when the eustachian tube becomes blocked or cannot open normally. Common causes of this condition include: Ear infections. Colds and other infections that affect the nose, mouth, and throat (upper respiratory tract). Allergies. Irritation from cigarette smoke. Irritation from stomach acid coming up into the esophagus (gastroesophageal reflux). The esophagus is the part of the body that moves food from the mouth to the stomach. Sudden changes in air pressure, such as from descending in an airplane or scuba diving. Abnormal growths in the nose or throat, such as: Growths that line the nose (nasal polyps). Abnormal growth of  cells (tumors). Enlarged tissue at the back of the throat (adenoids). What increases the risk? You are more likely to develop this condition if: You smoke. You are overweight. You are a child who has: Certain birth defects of the mouth, such as cleft palate. Large tonsils or adenoids. What are the signs or symptoms? Common symptoms of this condition include: A feeling of fullness in the ear. Ear pain. Clicking or popping noises in the ear. Ringing in the ear (tinnitus). Hearing loss. Loss of balance. Dizziness. Symptoms may get worse when the air pressure around you changes, such as when you travel to an area of high elevation, fly on an airplane, or go scuba diving. How is this diagnosed? This condition may be diagnosed based on: Your symptoms. A physical exam of your ears, nose, and throat. Tests, such as those that measure: The movement of your eardrum. Your hearing (audiometry). How is this treated? Treatment depends on the cause and severity of your condition. In mild cases, you may relieve your symptoms by moving air into your ears. This is called "popping the ears." In more severe cases, or if you have symptoms of fluid in your ears, treatment may include: Medicines to relieve congestion (decongestants). Medicines that treat allergies (antihistamines). Nasal sprays or ear drops that contain medicines that reduce swelling (steroids). A procedure to drain the fluid in your eardrum. In this procedure, a small tube may be placed in the eardrum to: Drain the fluid. Restore the air in the middle ear space. A procedure to insert a balloon device through the  nose to inflate the opening of the eustachian tube (balloon dilation). Follow these instructions at home: Lifestyle Do not do any of the following until your health care provider approves: Travel to high altitudes. Fly in airplanes. Work in a Estate agent or room. Scuba dive. Do not use any products that contain  nicotine or tobacco. These products include cigarettes, chewing tobacco, and vaping devices, such as e-cigarettes. If you need help quitting, ask your health care provider. Keep your ears dry. Wear fitted earplugs during showering and bathing. Dry your ears completely after. General instructions Take over-the-counter and prescription medicines only as told by your health care provider. Use techniques to help pop your ears as recommended by your health care provider. These may include: Chewing gum. Yawning. Frequent, forceful swallowing. Closing your mouth, holding your nose closed, and gently blowing as if you are trying to blow air out of your nose. Keep all follow-up visits. This is important. Contact a health care provider if: Your symptoms do not go away after treatment. Your symptoms come back after treatment. You are unable to pop your ears. You have: A fever. Pain in your ear. Pain in your head or neck. Fluid draining from your ear. Your hearing suddenly changes. You become very dizzy. You lose your balance. Get help right away if: You have a sudden, severe increase in any of your symptoms. Summary Eustachian tube dysfunction refers to a condition in which a blockage develops in the eustachian tube. It can be caused by ear infections, allergies, inhaled irritants, or abnormal growths in the nose or throat. Symptoms may include ear pain or fullness, hearing loss, or ringing in the ears. Mild cases are treated with techniques to unblock the ears, such as yawning or chewing gum. More severe cases are treated with medicines or procedures. This information is not intended to replace advice given to you by your health care provider. Make sure you discuss any questions you have with your health care provider. Document Revised: 11/10/2020 Document Reviewed: 11/10/2020 Elsevier Patient Education  2022 [employer].

## 2021-09-08 NOTE — Assessment & Plan Note (Signed)
Examination notable for right Eustachian tube dysfunction. Likely triggered from viral infection given congestion proceeding symptoms. -Continue conservative measures: saline nasal sprays, Neti Pot, Vicks vapor rub, etc.  -Flonase sent to pharmacy. Instructed on proper use -3-day course of Afrin prescribed -Valsalva maneuvers discussed -Return precautions given

## 2021-09-09 ENCOUNTER — Encounter: Payer: Self-pay | Admitting: Family Medicine

## 2021-09-10 ENCOUNTER — Ambulatory Visit
Admission: EM | Admit: 2021-09-10 | Discharge: 2021-09-10 | Disposition: A | Discharge status: Home / Self Care | Payer: BC Managed Care – PPO | Attending: Physician Assistant | Admitting: Physician Assistant

## 2021-09-10 ENCOUNTER — Other Ambulatory Visit: Payer: Self-pay

## 2021-09-10 ENCOUNTER — Encounter: Payer: Self-pay | Admitting: Emergency Medicine

## 2021-09-10 DIAGNOSIS — H6981 Other specified disorders of Eustachian tube, right ear: Secondary | ICD-10-CM | POA: Diagnosis not present

## 2021-09-10 MED ORDER — PREDNISONE 20 MG PO TABS: 40.0000 mg | ORAL_TABLET | Freq: Every day | ORAL | 0 refills | Status: AC

## 2021-09-10 NOTE — ED Provider Notes (Signed)
[facility]    CSN: [id number] Arrival date & time: 09/10/21  0932      History   Chief Complaint Chief Complaint  Patient presents with   Otalgia    HPI Kyle Mcmahon is a 25 y.o. male.   Patient here today for evaluation of right ear pain that is continued over the last 4 days.  He states that he was initially seen and prescribed Flonase which does seem to help somewhat but symptoms have persisted.  He has not had fever.  He denies sore throat or cough.  The history is provided by the patient.   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 Authorizing Provider  ABILIFY MAINTENA 300 MG PRSY prefilled syringe Inject into the muscle every 30 (thirty) days. 08/28/19  Yes [provider]  fluticasone (FLONASE) 50 MCG/ACT nasal spray Place 2 sprays into both nostrils daily. 09/08/21  Yes Sabino Dick, DO  lamoTRIgine (LAMICTAL) 150 MG tablet Take 150 mg by mouth 2 (two) times daily.   Yes [provider]  metFORMIN (GLUCOPHAGE) 500 MG tablet Take 1 tablet (500 mg total) by mouth 2 (two) times daily with a meal. 05/13/21  Yes Dana Allan, MD  oxymetazoline (AFRIN 12 HOUR) 0.05 % nasal spray Place 1 spray into both nostrils 2 (two) times daily. Use for 3 days 09/08/21  Yes  Sabino Dick, DO  predniSONE (DELTASONE) 20 MG tablet Take 2 tablets (40 mg total) by mouth daily with breakfast for 5 days. 09/10/21 09/15/21 Yes Tomi Bamberger, PA-C    Family History Family History  Problem Relation Age of Onset   Bipolar disorder Mother    Diabetes Mellitus II Paternal Grandfather    Diabetes Maternal Aunt     Social History Social History   Tobacco Use   Smoking status: Never   Smokeless tobacco: Never  Substance Use Topics   Alcohol use: Yes    Comment: 3-4 drinks 2-4 x per month   Drug use: Not Currently    Comment: Pt endorsed Marijuana use; UDS was clear     Allergies   Patient has no known allergies.   Review of Systems Review of Systems  Constitutional:  Negative for chills and fever.  HENT:  Positive for ear pain. Negative for congestion and sore throat.   Eyes:  Negative for discharge and redness.  Respiratory:  Negative for cough and shortness of breath.   Gastrointestinal:  Negative for abdominal pain, nausea and vomiting.    Physical Exam Triage Vital Signs ED Triage Vitals  Enc Vitals Group     BP      Pulse      Resp      Temp      Temp  src      SpO2      Weight      Height      Head Circumference      Peak Flow      Pain Score      Pain Loc      Pain Edu?      Excl. in GC?    No data found.  Updated Vital Signs BP (!) 147/93 (BP Location: Left Arm)    Pulse (!) 108    Temp 98.2 F (36.8 C) (Oral)    Resp 18    Ht 6' (1.829 m)    Wt 280 lb (127 kg)    SpO2 98%    BMI 37.97 kg/m       Physical Exam Vitals and nursing note reviewed.  Constitutional:      General: He is not in acute distress.    Appearance: Normal appearance. He is not ill-appearing.  HENT:     Head: Normocephalic and atraumatic.     Ears:     Comments: Bilateral TMs dull    Nose: Nose normal. No congestion.     Mouth/Throat:     Mouth: Mucous membranes are moist.     Pharynx: Oropharynx is clear. No oropharyngeal exudate or  posterior oropharyngeal erythema.  Eyes:     Conjunctiva/sclera: Conjunctivae normal.  Cardiovascular:     Rate and Rhythm: Normal rate.  Pulmonary:     Effort: Pulmonary effort is normal. No respiratory distress.  Skin:    General: Skin is warm and dry.  Neurological:     Mental Status: He is alert.  Psychiatric:        Mood and Affect: Mood normal.        Thought Content: Thought content normal.     UC Treatments / Results  Labs (all labs ordered are listed, but only abnormal results are displayed) Labs Reviewed - No data to display  EKG   Radiology No results found.  Procedures Procedures (including critical care time)  Medications Ordered in UC Medications - No data to display  Initial Impression / Assessment and Plan / UC Course  I have reviewed the triage vital signs and the nursing notes.  Pertinent labs & imaging results that were available during my care of the patient were reviewed by me and considered in my medical decision making (see chart for details).    Suspect likely continued eustachian tube dysfunction.  Since inhaled steroids have been somewhat helpful we will treat with oral steroids to hopefully resolve symptoms.  Recommend follow-up if symptoms fail to improve or worsen.  Final Clinical Impressions(s) / UC Diagnoses   Final diagnoses:  Eustachian tube dysfunction, right   Discharge Instructions   None    ED Prescriptions     Medication Sig Dispense Auth. Provider   predniSONE (DELTASONE) 20 MG tablet Take 2 tablets (40 mg total) by mouth daily with breakfast for 5 days. 10 tablet Tomi Bamberger, PA-C      PDMP not reviewed this encounter.   Tomi Bamberger, PA-C 09/10/21 1051

## 2021-09-10 NOTE — ED Triage Notes (Signed)
Patient c/o right ear pain that radiates down neck x 4 days.  Patient was having congestion and given Flonase which seems to help with that.

## 2021-09-11 NOTE — Telephone Encounter (Signed)
Do not recommend anything that is not FDA approved.  Can schedule an appointment to discuss further if requested.  Dana Allan, MD [facility]

## 2021-09-15 ENCOUNTER — Other Ambulatory Visit: Payer: Self-pay

## 2021-09-15 ENCOUNTER — Ambulatory Visit: Payer: BC Managed Care – PPO | Admitting: Family Medicine

## 2021-09-15 DIAGNOSIS — H6981 Other specified disorders of Eustachian tube, right ear: Secondary | ICD-10-CM

## 2021-09-15 DIAGNOSIS — E6609 Other obesity due to excess calories: Secondary | ICD-10-CM | POA: Diagnosis not present

## 2021-09-15 DIAGNOSIS — R7303 Prediabetes: Secondary | ICD-10-CM | POA: Diagnosis not present

## 2021-09-15 DIAGNOSIS — Z6838 Body mass index (BMI) 38.0-38.9, adult: Secondary | ICD-10-CM | POA: Diagnosis not present

## 2021-09-15 LAB — POCT GLYCOSYLATED HEMOGLOBIN (HGB A1C): HbA1c, POC (prediabetic range): 6.3 % (ref 5.7–6.4)

## 2021-09-15 NOTE — Patient Instructions (Addendum)
Good to see you today - Thank you for coming in  Things we discussed today:  For the ear - keep using flonase - Do mild eustachian exercise - slow swallowing - if not back to normal in 1 -2 months or if getting worse then let us know - Use Debrox once a week in both ears   Keep working on the weight and see Dr Clent Ridges in 3 months to discuss weight loss    Dr Clent Ridges in 3 months

## 2021-09-15 NOTE — Assessment & Plan Note (Signed)
Possibly combination of cerumen (he felt better once this was removed) and eustachian tube.  See after visit summary for suggestions Follow up if does not resolve.

## 2021-09-15 NOTE — Assessment & Plan Note (Signed)
He wondered about Wegovy and GoLo otc weight loss medications.  Discussed that he was losing weight with his current regimen (even over the holidays)  Suggest continue this approach and discuss with PCP in 3 months

## 2021-09-15 NOTE — Progress Notes (Signed)
° ° °  SUBJECTIVE:   CHIEF COMPLAINT / HPI:   R ear pain and fullness Seen in UC 12/29 - given prednisone 40 mg x 5 days.  A little better.  Still feels stopped up sometimes.  No fever or discharge   PERTINENT  PMH / PSH: Bipolar Gastritis   OBJECTIVE:   There were no vitals taken for this visit.  R Ear - large amount of wax.  After irrigation TM appears dull without masses or redness Neck:  No deformities, thyromegaly, masses, or tenderness noted.   Supple with full range of motion without pain.   ASSESSMENT/PLAN:   Eustachian tube dysfunction, right Possibly combination of cerumen (he felt better once this was removed) and eustachian tube.  See after visit summary for suggestions Follow up if does not resolve.    Obesity He wondered about Wegovy and GoLo otc weight loss medications.  Discussed that he was losing weight with his current regimen (even over the holidays)  Suggest continue this approach and discuss with PCP in 3 months      Carney Living, MD [facility]

## 2021-09-19 DIAGNOSIS — F3161 Bipolar disorder, current episode mixed, mild: Secondary | ICD-10-CM | POA: Diagnosis not present

## 2021-09-19 DIAGNOSIS — F1990 Other psychoactive substance use, unspecified, uncomplicated: Secondary | ICD-10-CM | POA: Diagnosis not present

## 2021-09-24 ENCOUNTER — Telehealth: Payer: Self-pay

## 2021-09-24 NOTE — Telephone Encounter (Signed)
Patient accepts appointment for 2nd Jynneos vaccination. °Kyle Mcmahon ° °

## 2021-10-05 ENCOUNTER — Other Ambulatory Visit: Payer: Self-pay

## 2021-10-05 ENCOUNTER — Ambulatory Visit (INDEPENDENT_AMBULATORY_CARE_PROVIDER_SITE_OTHER): Payer: BC Managed Care – PPO

## 2021-10-05 DIAGNOSIS — Z23 Encounter for immunization: Secondary | ICD-10-CM | POA: Diagnosis not present

## 2021-10-05 NOTE — Progress Notes (Signed)
° ° °  [facility]  Name:  MUNIR NIKIRK    MRN: [id number] DOB: Sep 27, 1995   10/05/2021  Mr. Hornback was observed post JYNNEOS immunization for 15 minutes without incident. He was provided with Vaccine Information Sheet and instruction to access the V-Safe system.   Mr. Schwein was instructed to call 911 with any severe reactions post vaccine: Difficulty breathing  Swelling of face and throat  A fast heartbeat  A bad rash all over body  Dizziness and weakness     Jaqulyn Chancellor T Brooks Sailors

## 2021-10-17 DIAGNOSIS — F1990 Other psychoactive substance use, unspecified, uncomplicated: Secondary | ICD-10-CM | POA: Diagnosis not present

## 2021-10-17 DIAGNOSIS — F3161 Bipolar disorder, current episode mixed, mild: Secondary | ICD-10-CM | POA: Diagnosis not present

## 2021-11-12 DIAGNOSIS — L089 Local infection of the skin and subcutaneous tissue, unspecified: Secondary | ICD-10-CM | POA: Diagnosis not present

## 2021-11-13 ENCOUNTER — Other Ambulatory Visit: Payer: Self-pay

## 2021-11-13 ENCOUNTER — Ambulatory Visit
Admission: EM | Admit: 2021-11-13 | Discharge: 2021-11-13 | Disposition: A | Discharge status: Home / Self Care | Payer: BC Managed Care – PPO

## 2021-11-13 DIAGNOSIS — L0291 Cutaneous abscess, unspecified: Secondary | ICD-10-CM

## 2021-11-13 NOTE — ED Provider Notes (Signed)
Patient here today for evaluation of abscess that was evaluated yesterday at another UC. He was prescribed doxycycline at that time. He is here today because he would like to have abscess incised and drained. After reviewing chart and physical exam it appears there was no fluctuance and therefore nothing to drain with abscess. On exam abscess is small (~ 1.5 cm) and there are many satellite lesions. Recommended he continue antibiotics, warm compresses, ibuprofen and follow up if no improvement over the next few days or sooner with any worsening.  ?  ?Tomi Bamberger, PA-C ?11/13/21 1710 ? ?

## 2021-11-13 NOTE — ED Triage Notes (Signed)
Pt c/o cyst under left arm first noticed ~ Monday.  ?

## 2021-11-14 DIAGNOSIS — F1990 Other psychoactive substance use, unspecified, uncomplicated: Secondary | ICD-10-CM | POA: Diagnosis not present

## 2021-11-14 DIAGNOSIS — F3161 Bipolar disorder, current episode mixed, mild: Secondary | ICD-10-CM | POA: Diagnosis not present

## 2021-11-27 DIAGNOSIS — L732 Hidradenitis suppurativa: Secondary | ICD-10-CM | POA: Diagnosis not present

## 2021-12-18 DIAGNOSIS — F3161 Bipolar disorder, current episode mixed, mild: Secondary | ICD-10-CM | POA: Diagnosis not present

## 2021-12-18 DIAGNOSIS — F1990 Other psychoactive substance use, unspecified, uncomplicated: Secondary | ICD-10-CM | POA: Diagnosis not present

## 2022-01-11 DIAGNOSIS — F3161 Bipolar disorder, current episode mixed, mild: Secondary | ICD-10-CM | POA: Diagnosis not present

## 2022-01-11 DIAGNOSIS — F1990 Other psychoactive substance use, unspecified, uncomplicated: Secondary | ICD-10-CM | POA: Diagnosis not present

## 2022-01-12 ENCOUNTER — Ambulatory Visit: Payer: BC Managed Care – PPO | Admitting: Family Medicine

## 2022-01-15 ENCOUNTER — Ambulatory Visit: Payer: BC Managed Care – PPO | Admitting: Family Medicine

## 2022-01-15 ENCOUNTER — Encounter: Payer: Self-pay | Admitting: Family Medicine

## 2022-01-15 VITALS — BP 108/60 | HR 93 | Ht 72.0 in | Wt 283.0 lb

## 2022-01-15 DIAGNOSIS — E6609 Other obesity due to excess calories: Secondary | ICD-10-CM

## 2022-01-15 DIAGNOSIS — Z6838 Body mass index (BMI) 38.0-38.9, adult: Secondary | ICD-10-CM

## 2022-01-15 DIAGNOSIS — R7303 Prediabetes: Secondary | ICD-10-CM

## 2022-01-15 LAB — POCT GLYCOSYLATED HEMOGLOBIN (HGB A1C): HbA1c, POC (prediabetic range): 5.9 % (ref 5.7–6.4)

## 2022-01-15 MED ORDER — SEMAGLUTIDE-WEIGHT MANAGEMENT 0.25 MG/0.5ML ~~LOC~~ SOAJ
0.2500 mg | SUBCUTANEOUS | 0 refills | Status: DC
Start: 2022-01-15 — End: 2022-01-20
  Filled 2022-01-18: qty 2, 28d supply, fill #0

## 2022-01-15 NOTE — Patient Instructions (Signed)
Thank you for coming to see me today. It was a pleasure.  ? ?Reginal Lutes ordered ? ?Please follow-up with PCP in 4 weeks ? ?If you have any questions or concerns, please do not hesitate to call the office at [phone]. ? ?Best,  ? ?Dana Allan, MD   ?

## 2022-01-15 NOTE — Progress Notes (Signed)
? ? ?  SUBJECTIVE:  ? ?CHIEF COMPLAINT / HPI: Discuss Mounjaro prescription ? ?Patient has been trying to lose weight.  Currently on wait list for healthy weight loss clinic.  Started [employer] weight loss program has not really been that helpful.  Was unable to get Ozempic prescribed under insurance.   ? ?PERTINENT  PMH / PSH:  ?Prediabetes ?Schizophrenia  ? ?OBJECTIVE:  ? ?BP 108/60   Pulse 93   Ht 6' (1.829 m)   Wt 283 lb (128.4 kg)   SpO2 98%   BMI 38.38 kg/m?   ? ?General: Alert, no acute distress ?HEENT: No lymphadenopathy, no thyromegaly or goiter appreciated. ? ? ?ASSESSMENT/PLAN:  ? ?Obesity ?Weight has increased 3 pounds since last visit but essentially remains unchanged.  History of prediabetes, BMI greater than 35 and hyperlipidemia. ?Currently on wait list for a healthy weight and wellness program ?Has enrolled in [facility] [employer] [facility] program. ?Ozempic is not FDA approved for weight loss management ?Has no history of thyroid cancer or family history of medullary thyroid cancer. ?A1c today ?Prescriptions sent for Wegovy 0.25 mg weekly ?Follow-up with PCP in 4 weeks ?  ? ?Dana Allan, MD ?[facility]  ?

## 2022-01-17 ENCOUNTER — Encounter: Payer: Self-pay | Admitting: Family Medicine

## 2022-01-17 NOTE — Assessment & Plan Note (Addendum)
Weight has increased 3 pounds since last visit but essentially remains unchanged.  History of prediabetes, BMI greater than 35 and hyperlipidemia. ?Currently on wait list for a healthy weight and wellness program ?Has enrolled in the [employer] weight loss program. ?Ozempic is not FDA approved for weight loss management ?Has no history of thyroid cancer or family history of medullary thyroid cancer. ?A1c today ?Prescriptions sent for Wegovy 0.25 mg weekly ?Follow-up with PCP in 4 weeks ?

## 2022-01-18 ENCOUNTER — Other Ambulatory Visit (HOSPITAL_COMMUNITY): Payer: Self-pay

## 2022-01-18 ENCOUNTER — Encounter: Payer: Self-pay | Admitting: Family Medicine

## 2022-01-19 ENCOUNTER — Other Ambulatory Visit (HOSPITAL_COMMUNITY): Payer: Self-pay

## 2022-01-20 ENCOUNTER — Other Ambulatory Visit (HOSPITAL_COMMUNITY): Payer: Self-pay

## 2022-01-20 MED ORDER — WEGOVY 0.25 MG/0.5ML ~~LOC~~ SOAJ: 0.2500 mg | SUBCUTANEOUS | 0 refills | Status: DC

## 2022-01-20 NOTE — Addendum Note (Signed)
Addended by: Lanice Shirts on: 01/20/2022 05:46 PM ? ? Modules accepted: Orders ? ?

## 2022-02-13 DIAGNOSIS — F3161 Bipolar disorder, current episode mixed, mild: Secondary | ICD-10-CM | POA: Diagnosis not present

## 2022-02-13 DIAGNOSIS — F1990 Other psychoactive substance use, unspecified, uncomplicated: Secondary | ICD-10-CM | POA: Diagnosis not present

## 2022-02-15 ENCOUNTER — Ambulatory Visit: Payer: BC Managed Care – PPO | Admitting: Family Medicine

## 2022-02-15 ENCOUNTER — Encounter: Payer: Self-pay | Admitting: Family Medicine

## 2022-02-15 VITALS — BP 125/74 | HR 87 | Ht 72.0 in | Wt 288.8 lb

## 2022-02-15 DIAGNOSIS — Z6839 Body mass index (BMI) 39.0-39.9, adult: Secondary | ICD-10-CM

## 2022-02-15 DIAGNOSIS — E6609 Other obesity due to excess calories: Secondary | ICD-10-CM

## 2022-02-15 NOTE — Patient Instructions (Signed)
Thank you for coming to see me today. It was a pleasure.   Check with your insurance company to see if there is a different plan that would cover the [facility].  Call healthy weight loss to see if they have any cancellations and see if you have increased your spot on the wait list.  Please follow-up with PCP as needed  If you have any questions or concerns, please do not hesitate to call the office at [phone].  Best,   Dana Allan, MD

## 2022-02-15 NOTE — Progress Notes (Signed)
    SUBJECTIVE:   CHIEF COMPLAINT / HPI: follow up weight management  No acute concerns today. Was unable to get Wegovy covered by insurance.  Continues to be on the wait list for healthy weight and wellness program.  PERTINENT  PMH / PSH:  Obesity class 2  OBJECTIVE:   BP 125/74   Pulse 87   Ht 6' (1.829 m)   Wt 288 lb 12.8 oz (131 kg)   SpO2 99%   BMI 39.17 kg/m    General: Alert, no acute distress Cardio: Normal S1 and S2, RRR, no r/m/g Pulm: CTAB, normal work of breathing   ASSESSMENT/PLAN:   Obesity Unfortunately unable to have Immunologist for weight loss.  He is still currently on the wait list for healthy weight and wellness clinic. -Recommend reaching out to healthy weight and wellness to see if any cancellations -Continue to encourage healthy lifestyle choices -Recommend increasing protein 30 g with each meal. -Discussed with patient that he can reach out to his insurance company to see if there is a plan that covers weight loss medications. -Follow-up with PCP as needed     Dana Allan, MD [facility]

## 2022-02-16 ENCOUNTER — Other Ambulatory Visit (HOSPITAL_COMMUNITY): Payer: Self-pay

## 2022-02-16 ENCOUNTER — Encounter: Payer: Self-pay | Admitting: *Deleted

## 2022-02-21 ENCOUNTER — Encounter: Payer: Self-pay | Admitting: Family Medicine

## 2022-02-21 NOTE — Assessment & Plan Note (Signed)
Unfortunately unable to have insurance cover [phone] for weight loss.  He is still currently on the wait list for healthy weight and wellness clinic. -Recommend reaching out to healthy weight and wellness to see if any cancellations -Continue to encourage healthy lifestyle choices -Recommend increasing protein 30 g with each meal. -Discussed with patient that he can reach out to his insurance company to see if there is a plan that covers weight loss medications. -Follow-up with PCP as needed

## 2022-03-02 ENCOUNTER — Telehealth: Payer: Self-pay

## 2022-03-02 DIAGNOSIS — F1990 Other psychoactive substance use, unspecified, uncomplicated: Secondary | ICD-10-CM | POA: Diagnosis not present

## 2022-03-02 DIAGNOSIS — F3161 Bipolar disorder, current episode mixed, mild: Secondary | ICD-10-CM | POA: Diagnosis not present

## 2022-03-02 NOTE — Telephone Encounter (Signed)
Received phone call from Selena Batten with [employer] regarding PA request for [facility]. Selena Batten states that they are unable to see where PA has been processed in their system.   Will forward to [facility] for further clarification on issue.   Veronda Prude, RN

## 2022-03-08 DIAGNOSIS — F1990 Other psychoactive substance use, unspecified, uncomplicated: Secondary | ICD-10-CM | POA: Diagnosis not present

## 2022-03-08 DIAGNOSIS — F3161 Bipolar disorder, current episode mixed, mild: Secondary | ICD-10-CM | POA: Diagnosis not present

## 2022-03-09 ENCOUNTER — Ambulatory Visit (HOSPITAL_COMMUNITY)
Admission: AD | Admit: 2022-03-09 | Discharge: 2022-03-09 | Disposition: A | Discharge status: Home / Self Care | Payer: BC Managed Care – PPO | Source: Intra-hospital | Attending: Psychiatry | Admitting: Psychiatry

## 2022-03-13 ENCOUNTER — Ambulatory Visit (HOSPITAL_COMMUNITY)
Admission: EM | Admit: 2022-03-13 | Discharge: 2022-03-13 | Disposition: A | Discharge status: Home / Self Care | Payer: BC Managed Care – PPO

## 2022-03-13 DIAGNOSIS — F308 Other manic episodes: Secondary | ICD-10-CM

## 2022-03-13 DIAGNOSIS — F319 Bipolar disorder, unspecified: Secondary | ICD-10-CM | POA: Diagnosis not present

## 2022-03-13 NOTE — ED Triage Notes (Signed)
Pt presents to [facility] accompanied by his mother. Pt has a hx of Bipolar 1 disorder. Pt states he was triggered over a week ago by his uncle he was making homophobic remarks to him. Pt states he began to have manic behavior after that incident. Pt states he no longer feels manic and feels calm now. Pt states that he receives an abilify injection once a month and is due for his next injection on 7/8. Pt denies SI/HI and AVH

## 2022-03-13 NOTE — Discharge Instructions (Signed)
Take all medications as prescribed. Keep all follow-up appointments as scheduled.  Do not consume alcohol or use illegal drugs while on prescription medications. Report any adverse effects from your medications to your primary care provider promptly.  In the event of recurrent symptoms or worsening symptoms, call 911, a crisis hotline, or go to the nearest emergency department for evaluation.   

## 2022-03-13 NOTE — ED Provider Notes (Signed)
[facility] Medical Screening Exam  Patient Name: Kyle Mcmahon MRN: [id number] Date of Evaluation: 03/13/22 Chief Complaint:   Diagnosis:  Final diagnoses:  Hypomania (HCC)  Bipolar 1 disorder (HCC)    History of Present illness: Kyle Mcmahon is a 26 y.o. male. patient presented to [facility] as a walk in accompanied by his mother with complaints of "rapid cycling."  Reported patient has a history with Bipolar disorder, anxiety and depression. Stated that he has been taken medications as indicated. He reported he was spoken to his primary psychiatrist Izzy, where he was recently prescribed ativan to help with the "mania."  Mother reported patient is talkative, pressured and is not at baseline."    Kyle Mcmahon, 26 y.o., male patient is denying suicidal or homicidal ideation. He  was by this provider, consulted with Dr. Gasper Sells; and chart reviewed on 03/13/22. Patient was offered inpatient admission, however declined. " My mother wanted me to come for a assessment and that is all."   During evaluation Kyle Mcmahon is sittinmg  in no acute distress. He is alert/oriented x 4; calm/cooperative; and mood congruent with affect. He is speaking in a clear tone at moderate volume, and normal pace; with good eye contact. His thought process is coherent and relevant; There is no indication that he is currently responding to internal/external stimuli or experiencing delusional thought content; and he has denied suicidal/self-harm/homicidal ideation, psychosis, and paranoia.   Patient has remained calm throughout assessment and has answered questions appropriately.     At this time Kyle Mcmahon is educated and verbalizes understanding of mental health resources and other crisis services in the community. He is instructed to call 911 and present to the nearest emergency room should he experience any suicidal/homicidal ideation, auditory/visual/hallucinations, or detrimental worsening  of his mental health condition.  He was a also advised by Clinical research associate that he could call the toll-free phone on insurance card to assist with identifying in network counselors and agencies or number on back of Medicaid card to speak with care coordinator.     Psychiatric Specialty Exam  Presentation  General Appearance:Appropriate for Environment  Eye Contact:Good  Speech:Clear and Coherent  Speech Volume:Normal  Handedness:Right   Mood and Affect  Mood:Anxious  Affect:Congruent   Thought Process  Thought Processes:Coherent  Descriptions of Associations:Intact  Orientation:Full (Time, Place and Person)  Thought Content:Logical    Hallucinations:None  Ideas of Reference:None  Suicidal Thoughts:No  Homicidal Thoughts:No   Sensorium  Memory:Immediate Good  Judgment:Fair  Insight:Fair   Executive Functions  Concentration:Fair  Attention Span:Fair  Recall:Good  Fund of Knowledge:Good  Language:Good   Psychomotor Activity  Psychomotor Activity:Normal   Assets  Assets:Desire for Improvement; Social Support   Sleep  Sleep:Fair  Number of hours: 3   Nutritional Assessment (For OBS and [facility]BC admissions only) Has the patient had a weight loss or gain of 10 pounds or more in the last 3 months?: No Has the patient had a decrease in food intake/or appetite?: No Does the patient have dental problems?: No Does the patient have eating habits or behaviors that may be indicators of an eating disorder including binging or inducing vomiting?: No Has the patient recently lost weight without trying?: 0 Has the patient been eating poorly because of a decreased appetite?: 0 Malnutrition Screening Tool Score: 0    Physical Exam: Physical Exam Vitals and nursing note reviewed.  HENT:     Head: Normocephalic.  Cardiovascular:  Rate and Rhythm: Normal rate.  Pulmonary:     Effort: Pulmonary effort is normal.     Breath sounds: Normal breath sounds.   Abdominal:     General: Abdomen is flat.  Neurological:     Mental Status: He is alert.  Psychiatric:        Attention and Perception: Attention normal.        Mood and Affect: Mood is anxious.        Speech: Speech normal.        Behavior: Behavior is cooperative.        Thought Content: Thought content normal. Thought content does not include suicidal ideation.        Cognition and Memory: Cognition normal.    Review of Systems  Eyes: Negative.   Cardiovascular: Negative.   Musculoskeletal: Negative.   Skin: Negative.   Psychiatric/Behavioral:  Negative for depression, substance abuse and suicidal ideas. The patient is nervous/anxious. The patient does not have insomnia.   All other systems reviewed and are negative.  Blood pressure 126/77, pulse 95, temperature 98.6 F (37 C), temperature source Oral, resp. rate 18, SpO2 97 %. There is no height or weight on file to calculate BMI.  Musculoskeletal: Strength & Muscle Tone: within normal limits Gait & Station: normal Patient leans: N/A   [facility] MSE Discharge Disposition for Follow up and Recommendations: Based on my evaluation the patient does not appear to have an emergency medical condition and can be discharged with resources and follow up care in outpatient services for Medication Management   Oneta Rack, NP 03/13/2022, 12:11 PM

## 2022-03-15 DIAGNOSIS — F3161 Bipolar disorder, current episode mixed, mild: Secondary | ICD-10-CM | POA: Diagnosis not present

## 2022-03-15 DIAGNOSIS — F1990 Other psychoactive substance use, unspecified, uncomplicated: Secondary | ICD-10-CM | POA: Diagnosis not present

## 2022-03-17 ENCOUNTER — Ambulatory Visit
Admission: RE | Admit: 2022-03-17 | Discharge: 2022-03-17 | Disposition: A | Discharge status: Home / Self Care | Payer: BC Managed Care – PPO | Source: Ambulatory Visit | Attending: Internal Medicine | Admitting: Internal Medicine

## 2022-03-17 VITALS — BP 135/85 | HR 95 | Temp 98.0°F | Resp 18

## 2022-03-17 DIAGNOSIS — L03011 Cellulitis of right finger: Secondary | ICD-10-CM

## 2022-03-17 MED ORDER — AMOXICILLIN-POT CLAVULANATE 875-125 MG PO TABS: 1.0000 | ORAL_TABLET | Freq: Two times a day (BID) | ORAL | 0 refills | Status: DC

## 2022-03-17 NOTE — ED Provider Notes (Signed)
[facility]    CSN: [id number] Arrival date & time: 03/17/22  1304      History   Chief Complaint Chief Complaint  Patient presents with   right finger infection    HPI Kyle Mcmahon is a 26 y.o. male.   Patient presents with swelling, erythema, pain to distal end of right third digit.  Denies any purulent drainage.  Denies any injury to the area.  Patient has used warm Epsom salt soaks with minimal improvement.     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 Authorizing Provider  amoxicillin-clavulanate (AUGMENTIN) 875-125 MG tablet Take 1 tablet by mouth every 12 (twelve) hours. 03/17/22  Yes , Analysia Dungee E, FNP  ABILIFY MAINTENA 300 MG PRSY prefilled syringe Inject into the muscle every 30 (thirty) days. 08/28/19   [provider]  fluticasone (FLONASE) 50 MCG/ACT nasal spray Place 2 sprays into both nostrils daily. 09/08/21   Sabino Dick, DO  lamoTRIgine (LAMICTAL) 150 MG tablet Take 150 mg by mouth 2 (two) times daily.    [provider]  metFORMIN (GLUCOPHAGE) 500 MG tablet Take 1 tablet (500 mg total) by mouth 2 (two) times daily with a meal. 05/13/21   Dana Allan, MD  Semaglutide-Weight Management (WEGOVY) 0.25 MG/0.5ML SOAJ Inject 0.25 mg into the skin once a week.  01/20/22   Dana Allan, MD    Family History Family History  Problem Relation Age of Onset   Bipolar disorder Mother    Diabetes Mellitus II Paternal Grandfather    Diabetes Maternal Aunt     Social History Social History   Tobacco Use   Smoking status: Never   Smokeless tobacco: Never  Substance Use Topics   Alcohol use: Yes    Comment: 3-4 drinks 2-4 x per month   Drug use: Not Currently    Comment: Pt endorsed Marijuana use; UDS was clear     Allergies   Paliperidone   Review of Systems Review of Systems Per HPI  Physical Exam Triage Vital Signs ED Triage Vitals [03/17/22 1337]  Enc Vitals Group     BP 135/85     Pulse Rate 95     Resp 18     Temp 98 F (36.7 C)     Temp Source Oral     SpO2 97 %     Weight      Height      Head Circumference      Peak Flow      Pain Score 0     Pain Loc      Pain Edu?      Excl. in GC?    No data found.  Updated Vital Signs BP 135/85 (BP Location: Left Arm)  Pulse 95   Temp 98 F (36.7 C) (Oral)   Resp 18   SpO2 97%   Visual Acuity Right Eye Distance:   Left Eye Distance:   Bilateral Distance:    Right Eye Near:   Left Eye Near:    Bilateral Near:     Physical Exam Constitutional:      General: He is not in acute distress.    Appearance: Normal appearance. He is not toxic-appearing or diaphoretic.  HENT:     Head: Normocephalic and atraumatic.  Eyes:     Extraocular Movements: Extraocular movements intact.     Conjunctiva/sclera: Conjunctivae normal.  Pulmonary:     Effort: Pulmonary effort is normal.  Skin:    Comments: Erythema and mild swelling to skin surrounding nail of right third digit.  No purulent drainage noted.  Neurological:     General: No focal deficit present.     Mental Status: He is alert and oriented to person, place, and time. Mental status is at baseline.  Psychiatric:        Mood and Affect: Mood normal.        Behavior: Behavior normal.        Thought Content:  Thought content normal.        Judgment: Judgment normal.      UC Treatments / Results  Labs (all labs ordered are listed, but only abnormal results are displayed) Labs Reviewed - No data to display  EKG   Radiology No results found.  Procedures Procedures (including critical care time)  Medications Ordered in UC Medications - No data to display  Initial Impression / Assessment and Plan / UC Course  I have reviewed the triage vital signs and the nursing notes.  Pertinent labs & imaging results that were available during my care of the patient were reviewed by me and considered in my medical decision making (see chart for details).     Paronychia is present.  Will prescribe Augmentin given patient recently took doxycycline a few weeks prior for a different abscess.  Patient advised to continue warm Epsom salt soaks.  Do not think that drainage is necessary today given minimal appearance on physical exam.  Patient encouraged to follow-up if symptoms persist or worsen.  Patient verbalized understanding and was agreeable with plan. Final Clinical Impressions(s) / UC Diagnoses   Final diagnoses:  Paronychia of finger of right hand     Discharge Instructions      You have a paronychia of your finger which is an infection surrounding skin of your nail.  You are being treated with antibiotics.  Also recommend warm Epsom salt soaks.  Please follow-up if symptoms persist or worsen.    ED Prescriptions     Medication Sig Dispense Auth. Provider   amoxicillin-clavulanate (AUGMENTIN) 875-125 MG tablet Take 1 tablet by mouth every 12 (twelve) hours. 14 tablet [place], Acie Fredrickson, [place]      PDMP not reviewed this encounter.   Gustavus Bryant, [place] 03/17/22 1357

## 2022-03-17 NOTE — Discharge Instructions (Signed)
You have a paronychia of your finger which is an infection surrounding skin of your nail.  You are being treated with antibiotics.  Also recommend warm Epsom salt soaks.  Please follow-up if symptoms persist or worsen.

## 2022-03-17 NOTE — ED Triage Notes (Signed)
Pt c/o right middle finger infection onset a few days ago

## 2022-03-22 DIAGNOSIS — F1990 Other psychoactive substance use, unspecified, uncomplicated: Secondary | ICD-10-CM | POA: Diagnosis not present

## 2022-03-22 DIAGNOSIS — F3161 Bipolar disorder, current episode mixed, mild: Secondary | ICD-10-CM | POA: Diagnosis not present

## 2022-03-30 ENCOUNTER — Telehealth (HOSPITAL_COMMUNITY): Payer: Self-pay | Admitting: Family Medicine

## 2022-03-30 NOTE — BH Assessment (Signed)
Care Management - [facility] Follow Up Discharges  ° °Writer attempted to make contact with patient today and was unsuccessful.  Writer left a HIPPA compliant voice message.  ° °Per chart review, patient was provided with outpatient resources. ° °

## 2022-04-17 DIAGNOSIS — F3161 Bipolar disorder, current episode mixed, mild: Secondary | ICD-10-CM | POA: Diagnosis not present

## 2022-04-17 DIAGNOSIS — F1990 Other psychoactive substance use, unspecified, uncomplicated: Secondary | ICD-10-CM | POA: Diagnosis not present

## 2022-04-21 ENCOUNTER — Ambulatory Visit
Admission: EM | Admit: 2022-04-21 | Discharge: 2022-04-21 | Disposition: A | Discharge status: Home / Self Care | Payer: BC Managed Care – PPO

## 2022-04-21 DIAGNOSIS — L02415 Cutaneous abscess of right lower limb: Secondary | ICD-10-CM | POA: Diagnosis not present

## 2022-04-21 NOTE — ED Triage Notes (Signed)
Pt c/o recurring cyst to right inner thigh/groin area.

## 2022-04-21 NOTE — ED Provider Notes (Signed)
[facility]    CSN: [id number] Arrival date & time: 04/21/22  1009      History   Chief Complaint Chief Complaint  Patient presents with   Abscess    HPI KEVON TENCH is a 26 y.o. male.   Patient reports abscess flareup to right inner thigh.  Patient reports history of hidradenitis suppurativa and states gets flareups routinely.  Patient is followed by dermatology.  He states that they called dermatologist today and a refill of doxycycline was sent to pharmacy that he has not yet picked up.  He wants to be sure that drainage is not necessary.  Denies fever, body aches, chills.   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 Authorizing Provider  ABILIFY MAINTENA 300 MG PRSY prefilled syringe Inject into the muscle every 30 (thirty) days. 08/28/19   [provider]  amoxicillin-clavulanate (AUGMENTIN) 875-125 MG tablet Take 1 tablet by mouth every 12 (twelve) hours. 03/17/22   Gustavus Bryant, FNP  fluticasone (FLONASE) 50 MCG/ACT nasal spray Place 2 sprays into both nostrils daily. 09/08/21   Sabino Dick, DO  lamoTRIgine (LAMICTAL) 150 MG tablet Take 150 mg by mouth 2 (two) times daily.    [provider]  metFORMIN (GLUCOPHAGE) 500 MG tablet Take 1 tablet (500 mg  total) by mouth 2 (two) times daily with a meal. 05/13/21   Dana Allan, MD  Semaglutide-Weight Management (WEGOVY) 0.25 MG/0.5ML SOAJ Inject 0.25 mg into the skin once a week. 01/20/22   Dana Allan, MD    Family History Family History  Problem Relation Age of Onset   Bipolar disorder Mother    Diabetes Mellitus II Paternal Grandfather    Diabetes Maternal Aunt     Social History Social History   Tobacco Use   Smoking status: Never   Smokeless tobacco: Never  Substance Use Topics   Alcohol use: Yes    Comment: 3-4 drinks 2-4 x per month   Drug use: Not Currently    Comment: Pt endorsed Marijuana use; UDS was clear     Allergies   Paliperidone   Review of Systems Review of Systems Per HPI  Physical Exam Triage Vital Signs ED Triage Vitals [04/21/22 1055]  Enc Vitals Group     BP (!) 150/85     Pulse Rate 88     Resp 18     Temp 98 F (36.7 C)     Temp Source Oral     SpO2 97 %     Weight      Height      Head Circumference      Peak Flow      Pain Score 0     Pain Loc  Pain Edu?      Excl. in GC?    No data found.  Updated Vital Signs BP (!) 150/85 (BP Location: Left Arm)   Pulse 88   Temp 98 F (36.7 C) (Oral)   Resp 18   SpO2 97%   Visual Acuity Right Eye Distance:   Left Eye Distance:   Bilateral Distance:    Right Eye Near:   Left Eye Near:    Bilateral Near:     Physical Exam Constitutional:      General: He is not in acute distress.    Appearance: Normal appearance. He is not toxic-appearing or diaphoretic.  HENT:     Head: Normocephalic and atraumatic.  Eyes:     Extraocular Movements: Extraocular movements intact.     Conjunctiva/sclera: Conjunctivae normal.  Pulmonary:     Effort: Pulmonary effort is normal.  Skin:    Comments: Patient has 2-3 areas of induration present to right inner thigh.  One is approximately 1.5 cm in diameter.  The other is approximately 2 to 2.5 cm in diameter. They are directly adjacent to one  another.  No fluctuance or drainage noted.  Neurological:     General: No focal deficit present.     Mental Status: He is alert and oriented to person, place, and time. Mental status is at baseline.  Psychiatric:        Mood and Affect: Mood normal.        Behavior: Behavior normal.        Thought Content: Thought content normal.        Judgment: Judgment normal.      UC Treatments / Results  Labs (all labs ordered are listed, but only abnormal results are displayed) Labs Reviewed - No data to display  EKG   Radiology No results found.  Procedures Procedures (including critical care time)  Medications Ordered in UC Medications - No data to display  Initial Impression / Assessment and Plan / UC Course  I have reviewed the triage vital signs and the nursing notes.  Pertinent labs & imaging results that were available during my care of the patient were reviewed by me and considered in my medical decision making (see chart for details).     No I&D indicated at this time given the abscesses are very indurated.  Advised to continue warm compresses.  Advised patient to pick up doxycycline antibiotic prescribed by dermatologist today at pharmacist and start taking given that I feel it is necessary.  Patient to follow-up if symptoms persist or worsen.  Patient verbalized understanding and was agreeable with plan. Final Clinical Impressions(s) / UC Diagnoses   Final diagnoses:  Abscess of right thigh     Discharge Instructions      Start taking antibiotic that was prescribed by your dermatologist.  Recommend to continue warm compresses as well.  Follow-up if symptoms persist or worsen.    ED Prescriptions   None    PDMP not reviewed this encounter.   Gustavus Bryant, [place] 04/21/22 1134

## 2022-04-21 NOTE — Discharge Instructions (Signed)
Start taking antibiotic that was prescribed by your dermatologist.  Recommend to continue warm compresses as well.  Follow-up if symptoms persist or worsen.

## 2022-05-15 DIAGNOSIS — F3161 Bipolar disorder, current episode mixed, mild: Secondary | ICD-10-CM | POA: Diagnosis not present

## 2022-05-15 DIAGNOSIS — F1990 Other psychoactive substance use, unspecified, uncomplicated: Secondary | ICD-10-CM | POA: Diagnosis not present

## 2022-06-12 DIAGNOSIS — F3161 Bipolar disorder, current episode mixed, mild: Secondary | ICD-10-CM | POA: Diagnosis not present

## 2022-06-12 DIAGNOSIS — F1990 Other psychoactive substance use, unspecified, uncomplicated: Secondary | ICD-10-CM | POA: Diagnosis not present

## 2022-07-05 DIAGNOSIS — F1990 Other psychoactive substance use, unspecified, uncomplicated: Secondary | ICD-10-CM | POA: Diagnosis not present

## 2022-07-05 DIAGNOSIS — F3161 Bipolar disorder, current episode mixed, mild: Secondary | ICD-10-CM | POA: Diagnosis not present

## 2022-08-02 DIAGNOSIS — F3161 Bipolar disorder, current episode mixed, mild: Secondary | ICD-10-CM | POA: Diagnosis not present

## 2022-08-02 DIAGNOSIS — F1990 Other psychoactive substance use, unspecified, uncomplicated: Secondary | ICD-10-CM | POA: Diagnosis not present

## 2022-08-27 ENCOUNTER — Encounter: Payer: Self-pay | Admitting: Family Medicine

## 2022-08-27 ENCOUNTER — Ambulatory Visit (INDEPENDENT_AMBULATORY_CARE_PROVIDER_SITE_OTHER): Payer: BC Managed Care – PPO | Admitting: Family Medicine

## 2022-08-27 VITALS — BP 118/80 | HR 92 | Temp 98.1°F | Ht 72.0 in | Wt 288.8 lb

## 2022-08-27 DIAGNOSIS — R7303 Prediabetes: Secondary | ICD-10-CM

## 2022-08-27 DIAGNOSIS — R0683 Snoring: Secondary | ICD-10-CM | POA: Diagnosis not present

## 2022-08-27 DIAGNOSIS — Z6839 Body mass index (BMI) 39.0-39.9, adult: Secondary | ICD-10-CM | POA: Diagnosis not present

## 2022-08-27 LAB — POCT GLYCOSYLATED HEMOGLOBIN (HGB A1C): HbA1c, POC (prediabetic range): 6.2 % (ref 5.7–6.4)

## 2022-08-27 MED ORDER — SEMAGLUTIDE(0.25 OR 0.5MG/DOS) 2 MG/1.5ML ~~LOC~~ SOPN: 0.2500 mg | PEN_INJECTOR | SUBCUTANEOUS | 3 refills | Status: DC

## 2022-08-27 NOTE — Progress Notes (Signed)
SUBJECTIVE:   Chief compliant/HPI: annual examination  Kyle Mcmahon is a 26 y.o. who presents today for an annual exam.   Ear wax build up  Complains of fullness in his right ear. Feels like it needs to be irrigated. Mentions he gets a lot of wax build up and comes in occasionally to get it irrigated. He feels like it is the same as prior. Denies any fevers. No congestion. Denies allergies. Says he sometimes gets seasonal allergies. Happens in both ears but this time mainly the right ear.   Obesity and Sleep  Also would like to discuss Wegovy. Has been trying to get on it in the past and has had trouble with insurance. Mentions weight gain has always been an issue for him. Says he started gaining weight in around 2019. He started Abilify after that. Snores in his sleep. He has bad symptoms he says. Had a sleep study in 2021. Wakes up tired sometimes. Has never fallen asleep driving. Says he is very likely to fall asleep in the afternoon if he is just sitting. Has never fallen asleep talking to someone. He says snoring and sleep might have been related to his weight gain. He says when he was thinner he did not have sleep issues or snoring.   Reviewed and updated history Prediabetes, HLD, Obesity, Bipolar 1, .   Review of systems form notable for fullness in right ear.   Family history of diabetes in aunt and grandfather  OBJECTIVE:   BP 118/80   Pulse 92   Temp 98.1 F (36.7 C)   Ht 6' (1.829 m)   Wt 288 lb 12.8 oz (131 kg)   SpO2 97%   BMI 39.17 kg/m   Genera: well appearing, obese  HEENT: R ear with some thick cerumen, L ear with copious cerumen bilateral TM pearly gray, no impaction  Neck: < 40 cms, Approx 35 cm  CV: RRR, well perfused  Resp: Normal work of breathing on room air  Abd: non tender, non distended, soft  Derm: acanthosis nigricans around neck  Psyc: very pleasant   ASSESSMENT/PLAN:    Class 2 severe obesity with serious comorbidity and body mass index  (BMI) of 39.0 to 39.9 in adult, unspecified obesity type [facility]) Assessment & Plan: Most likely due to lifestyle factors and exacerbated by long term use of abilify. Could also be exacerbated by potential OSA.  - Educated regarding lifestyle modifications  - Still on weight list for weight center   Orders: -     Ambulatory referral to Sleep Studies -     Semaglutide(0.25 or 0.5MG /DOS); Inject 0.25 mg into the skin once a week. 0.25 mg once weekly for 4 weeks then increase to 0.5 mg weekly for at least 4 weeks,max 1 mg  Dispense: 3 mL; Refill: 3  Prediabetes -     POCT glycosylated hemoglobin (Hb A1C)  Snoring Assessment & Plan: STOP-BANG score of 4, intermediate risk. Previous sleep study in 2021 was negative for OSA; however, patient has gained weight since then and says symptoms have worsened. Could be an exacerbating factor causing a positive cycle with obesity.  - Ambulatory Referral to sleep studies      Annual Examination  See AVS for age appropriate recommendations  PHQ score 0, reviewed and discussed.  Blood pressure reviewed and at goal.     Considered the following items based upon USPSTF recommendations: HIV testing: not indicated Hepatitis C: not indicated Hepatitis B: not indicated Syphilis if  at high risk: {declined GC/CTdeclined Lipid panel (nonfasting or fasting) discussed based upon AHA recommendations and not ordered.  Last checked 1 year prior Consider repeat every 4-6 years.  Reviewed risk factors for latent tuberculosis and not indicated Immunizations : has had flu shot this year.    Patient does have sex with men and is interested in PrEP. Would like information on PrEP this time and will consider at next visit.   Follow up in 1 year or sooner if indicated.    Lowry Ram, MD [place]

## 2022-08-27 NOTE — Assessment & Plan Note (Signed)
STOP-BANG score of 4, intermediate risk. Previous sleep study in 2021 was negative for OSA; however, patient has gained weight since then and says symptoms have worsened. Could be an exacerbating factor causing a positive cycle with obesity.

## 2022-08-27 NOTE — Patient Instructions (Signed)
It was great to see you today! Thank you for choosing [facility] for your primary care. Berta Minor was seen for a physical.  Today we addressed: Ear wax build up - Please use a cloth (like a dishtowel or tshirt) and dip into mineral oil or baby oil and drop 1-2 drops in each ear a couple times a week. This will help make the wax less thick.  Obesity - I am checking your A1c today. I will prescribe wegovy and see if insurance will pay for it.  Snoring - For your snoring and intermediate risk of sleep apnea, I recommend getting a sleep study. I will order one today.   If you haven't already, sign up for My Chart to have easy access to your labs results, and communication with your primary care physician.  We are checking some labs today. If they are abnormal, I will call you. If they are normal, I will send you a MyChart message (if it is active) or a letter in the mail. If you do not hear about your labs in the next 2 weeks, please call the office.   You should return to our clinic No follow-ups on file.  I recommend that you always bring your medications to each appointment as this makes it easy to ensure you are on the correct medications and helps [place] not miss refills when you need them.  Please arrive 15 minutes before your appointment to ensure smooth check in process.  We appreciate your efforts in making this happen.  Please call the clinic at [phone] if your symptoms worsen or you have any concerns.  Thank you for allowing me to participate in your care, Lockie Mola, MD 08/27/2022, 4:41 PM PGY-1, [facility]

## 2022-08-27 NOTE — Assessment & Plan Note (Signed)
Most likely due to lifestyle factors and exacerbated by long term use of abilify. Could also be exacerbated by potential OSA.  - Educated regarding lifestyle modifications  - Still on weight list for weight center

## 2022-08-31 ENCOUNTER — Telehealth: Payer: Self-pay

## 2022-08-31 ENCOUNTER — Ambulatory Visit: Payer: BC Managed Care – PPO | Admitting: Student

## 2022-08-31 ENCOUNTER — Encounter: Payer: Self-pay | Admitting: Student

## 2022-08-31 ENCOUNTER — Other Ambulatory Visit (HOSPITAL_COMMUNITY): Payer: Self-pay

## 2022-08-31 VITALS — BP 110/60 | HR 94 | Ht 72.0 in | Wt 289.0 lb

## 2022-08-31 DIAGNOSIS — L732 Hidradenitis suppurativa: Secondary | ICD-10-CM | POA: Diagnosis not present

## 2022-08-31 DIAGNOSIS — R7303 Prediabetes: Secondary | ICD-10-CM

## 2022-08-31 MED ORDER — DOXYCYCLINE HYCLATE 100 MG PO TABS: 100.0000 mg | ORAL_TABLET | Freq: Two times a day (BID) | ORAL | 0 refills | Status: AC

## 2022-08-31 MED ORDER — TRULICITY 0.75 MG/0.5ML ~~LOC~~ SOAJ: 0.7500 mg | SUBCUTANEOUS | 2 refills | Status: DC

## 2022-08-31 NOTE — Progress Notes (Signed)
    SUBJECTIVE:   CHIEF COMPLAINT / HPI:   Patient is a 26 year old male presenting today for knot on the back. Patient report two Knots at the back of his neck First knot on the left was found 3 days ago and the right Knot was noted yesterday. Describes Knot as tender to touch and painful with turning of neck  but not itchy These knots have grown in size since it was first noticed. Endorses history of Hydradenitis with cyst mostly in his inner thigh and pelvic crease HS has responded well to doxycycline in the past. Denies any rest trauma to the neck, fever or chills    PERTINENT  PMH / PSH: Reviewed   OBJECTIVE:   BP 110/60   Pulse 94   Ht 6' (1.829 m)   Wt 289 lb (131.1 kg)   SpO2 97%   BMI 39.20 kg/m    Physical Exam General: Alert, well appearing, NAD Neck: Two palpable non mobile knot about 59mm each, both tender on palpation. Right knot has an opening.  Acanthosis nigricans present Cardiovascular: RRR, well-perfused Respiratory: Normal work of breathing on room air    ASSESSMENT/PLAN:   Neck Mass Pt with two palpable 51mm tender mass in the posterior neck consistent with a cyst.  Although unusual location, neck cyst is concerning for possible hidradenitis cyst given prior history. Will treat with doxycycline 100 mg twice daily for 14 days.  Discussed return precaution with patient.  He will follow-up in 1-2 weeks to reevaluate cyst.  Prediabetes Patient's most recent A1c was 6.2.  He was supposed to start [facility] weekly however medication was not approved by his insurance.  Started patient on Trulicity 0.75 mg weekly.  Jerre Simon, MD [place] [facility]

## 2022-08-31 NOTE — Telephone Encounter (Signed)
Rec'd PA request for patients medication OZEMPIC.  Per insurance:  Please note for this inquiry: Indications related to prediabetes, obesity or weight loss therapy are not approved by the FDA for the drug being requested. As a result, a pathway to approval will not be recommended for these indications.  The drug you are requesting prior authorization for is not covered. Please select an alternative drug from the choices provided and send in a new prescription for that drug: METFORMIN, TRULICITY BYETTA, BYDUREON

## 2022-08-31 NOTE — Patient Instructions (Addendum)
It was wonderful to meet you today. Thank you for allowing me to be a part of your care. Below is a short summary of what we discussed at your visit today:  Note that your back is likely due to developing hidradenitis cyst.  I have sent in prescription for doxycycline 100 mg which you will take twice daily for 14 days.  Because your insurance does not cover [phone] I have sent in prescription for Trulicity which you will take 0.75 weekly.  Please follow-up with [place] in 1-2 weeks to reevaluate the knot at the back of your neck.  You can come in earlier if worsening symptoms.  Please bring all of your medications to every appointment!  If you have any questions or concerns, please do not hesitate to contact us via phone or MyChart message.   Jerre Simon, MD [facility]

## 2022-09-03 ENCOUNTER — Ambulatory Visit: Payer: BC Managed Care – PPO

## 2022-09-08 DIAGNOSIS — L732 Hidradenitis suppurativa: Secondary | ICD-10-CM | POA: Diagnosis not present

## 2022-09-09 ENCOUNTER — Other Ambulatory Visit (HOSPITAL_COMMUNITY): Payer: Self-pay

## 2022-09-09 ENCOUNTER — Telehealth: Payer: Self-pay

## 2022-09-09 NOTE — Telephone Encounter (Signed)
A Prior Authorization was initiated for this patients TRULICITY through CoverMyMeds.   Key: [id number]

## 2022-09-14 NOTE — Telephone Encounter (Signed)
Prior Auth for patients medication TRULICITY denied by [employer] via CoverMyMeds.   REASON: COVERAGE IS PROVIDED FOR THE DIAGNOSIS OF TYPE 2 DIABETES  Letter scanned to pt's media  CoverMyMeds Key: [id number]

## 2022-09-23 ENCOUNTER — Telehealth: Payer: Self-pay

## 2022-09-23 NOTE — Telephone Encounter (Signed)
Pt would like to speak to his PCP. Pt has been denied 2 meds for weight loss and would like to know if there is another medicine we can try. Please give pt a call. Ottis Stain, CMA

## 2022-09-23 NOTE — Telephone Encounter (Signed)
Explained to mother that patient is due for a follow up visit per last note at his convenience.  She will reach out to patient and let him know.  I was speaking with her about her own matter and she asked about her son.  Rithvik Orcutt,CMA

## 2022-09-28 DIAGNOSIS — F1990 Other psychoactive substance use, unspecified, uncomplicated: Secondary | ICD-10-CM | POA: Diagnosis not present

## 2022-09-28 DIAGNOSIS — F3161 Bipolar disorder, current episode mixed, mild: Secondary | ICD-10-CM | POA: Diagnosis not present

## 2022-10-01 ENCOUNTER — Encounter: Payer: Self-pay | Admitting: Student

## 2022-10-04 ENCOUNTER — Other Ambulatory Visit: Payer: Self-pay | Admitting: Student

## 2022-10-04 DIAGNOSIS — R7303 Prediabetes: Secondary | ICD-10-CM

## 2022-10-04 MED ORDER — SEMAGLUTIDE(0.25 OR 0.5MG/DOS) 2 MG/1.5ML ~~LOC~~ SOPN: 0.2500 mg | PEN_INJECTOR | SUBCUTANEOUS | 2 refills | Status: DC

## 2022-10-04 NOTE — Progress Notes (Signed)
Sent in prescription for Ozempic for prediabetes.  1 and Trulicity denied by insurance.

## 2022-10-14 ENCOUNTER — Other Ambulatory Visit (HOSPITAL_COMMUNITY): Payer: Self-pay

## 2022-10-25 DIAGNOSIS — F1990 Other psychoactive substance use, unspecified, uncomplicated: Secondary | ICD-10-CM | POA: Diagnosis not present

## 2022-10-25 DIAGNOSIS — F3161 Bipolar disorder, current episode mixed, mild: Secondary | ICD-10-CM | POA: Diagnosis not present

## 2022-11-05 DIAGNOSIS — Z6841 Body Mass Index (BMI) 40.0 and over, adult: Secondary | ICD-10-CM | POA: Diagnosis not present

## 2022-11-05 DIAGNOSIS — Z1339 Encounter for screening examination for other mental health and behavioral disorders: Secondary | ICD-10-CM | POA: Diagnosis not present

## 2022-11-05 DIAGNOSIS — Z1331 Encounter for screening for depression: Secondary | ICD-10-CM | POA: Diagnosis not present

## 2022-11-05 DIAGNOSIS — Z0000 Encounter for general adult medical examination without abnormal findings: Secondary | ICD-10-CM | POA: Diagnosis not present

## 2022-11-09 DIAGNOSIS — F1990 Other psychoactive substance use, unspecified, uncomplicated: Secondary | ICD-10-CM | POA: Diagnosis not present

## 2022-11-09 DIAGNOSIS — F3161 Bipolar disorder, current episode mixed, mild: Secondary | ICD-10-CM | POA: Diagnosis not present

## 2022-11-22 DIAGNOSIS — F3161 Bipolar disorder, current episode mixed, mild: Secondary | ICD-10-CM | POA: Diagnosis not present

## 2022-11-22 DIAGNOSIS — F1990 Other psychoactive substance use, unspecified, uncomplicated: Secondary | ICD-10-CM | POA: Diagnosis not present

## 2022-12-07 DIAGNOSIS — L089 Local infection of the skin and subcutaneous tissue, unspecified: Secondary | ICD-10-CM | POA: Diagnosis not present

## 2022-12-07 DIAGNOSIS — L732 Hidradenitis suppurativa: Secondary | ICD-10-CM | POA: Diagnosis not present

## 2022-12-20 DIAGNOSIS — E119 Type 2 diabetes mellitus without complications: Secondary | ICD-10-CM | POA: Diagnosis not present

## 2022-12-20 DIAGNOSIS — Z6841 Body Mass Index (BMI) 40.0 and over, adult: Secondary | ICD-10-CM | POA: Diagnosis not present

## 2023-01-14 ENCOUNTER — Other Ambulatory Visit (HOSPITAL_COMMUNITY): Payer: Self-pay

## 2023-01-18 ENCOUNTER — Other Ambulatory Visit (HOSPITAL_COMMUNITY): Payer: Self-pay

## 2023-01-24 DIAGNOSIS — F1990 Other psychoactive substance use, unspecified, uncomplicated: Secondary | ICD-10-CM | POA: Diagnosis not present

## 2023-01-24 DIAGNOSIS — F3161 Bipolar disorder, current episode mixed, mild: Secondary | ICD-10-CM | POA: Diagnosis not present

## 2023-02-11 ENCOUNTER — Telehealth: Payer: Self-pay | Admitting: Emergency Medicine

## 2023-02-11 ENCOUNTER — Ambulatory Visit
Admission: RE | Admit: 2023-02-11 | Discharge: 2023-02-11 | Disposition: A | Discharge status: Home / Self Care | Payer: BC Managed Care – PPO | Source: Ambulatory Visit | Attending: Internal Medicine | Admitting: Internal Medicine

## 2023-02-11 VITALS — BP 131/83 | HR 95 | Temp 98.0°F | Resp 18 | Ht 72.0 in | Wt 280.0 lb

## 2023-02-11 DIAGNOSIS — J039 Acute tonsillitis, unspecified: Secondary | ICD-10-CM | POA: Diagnosis not present

## 2023-02-11 DIAGNOSIS — Z113 Encounter for screening for infections with a predominantly sexual mode of transmission: Secondary | ICD-10-CM | POA: Insufficient documentation

## 2023-02-11 DIAGNOSIS — J029 Acute pharyngitis, unspecified: Secondary | ICD-10-CM | POA: Diagnosis not present

## 2023-02-11 LAB — POCT RAPID STREP A (OFFICE): Rapid Strep A Screen: NEGATIVE

## 2023-02-11 MED ORDER — AMOXICILLIN-POT CLAVULANATE 875-125 MG PO TABS: 1.0000 | ORAL_TABLET | Freq: Two times a day (BID) | ORAL | 0 refills | Status: DC

## 2023-02-11 NOTE — ED Triage Notes (Signed)
Patient c/o sore throat x 5 days.  The pain is more of an itch than pain.  Patient has taken Zyrtec.  No other sx's.

## 2023-02-11 NOTE — Discharge Instructions (Signed)
I have prescribed you an antibiotic for throat infection.  Throat culture and STD swab of throat is pending.  Will call if it is abnormal and treat if necessary.  Follow-up if any symptoms persist or worsen.

## 2023-02-11 NOTE — Telephone Encounter (Signed)
Per Rolly Salter, she would like to extend patient's prescription for Augmentin from 7 days to 10 days.  Pharmacy notified.

## 2023-02-11 NOTE — ED Provider Notes (Addendum)
[facility]    CSN: [id number] Arrival date & time: 02/11/23  1453      History   Chief Complaint Chief Complaint  Patient presents with   Sore Throat    HPI Kyle Mcmahon is a 27 y.o. male.   Patient presents with throat irritation for about 5 days.  Reports that it is mildly painful but is mainly itchy.  Denies nasal congestion, runny nose, cough, fever.  Patient is taking Zyrtec for symptoms.  Denies any known sick contacts.  Patient is concerned that it could be STD related as he did perform oral intercourse prior to symptoms starting.  Although, denies any obvious exposure to STD.   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 Authorizing Provider  ABILIFY MAINTENA 300 MG PRSY prefilled syringe Inject into the muscle every 30 (thirty) days. 08/28/19  Yes [provider]  amoxicillin-clavulanate (AUGMENTIN) 875-125 MG tablet Take 1 tablet by mouth every 12 (twelve) hours. 02/11/23  Yes , Acie Fredrickson, FNP  lamoTRIgine (LAMICTAL) 150 MG tablet Take 150 mg by mouth 2 (two) times daily.   Yes [provider]  Semaglutide,0.25 or 0.5MG /DOS, 2 MG/1.5ML SOPN Inject 0.25 mg into the skin once a week. 0.25 mg once weekly for 4 weeks then increase to  0.5 mg weekly for at least 4 weeks,max 1 mg 10/04/22  Yes Jerre Simon, MD    Family History Family History  Problem Relation Age of Onset   Bipolar disorder Mother    Diabetes Mellitus II Paternal Grandfather    Diabetes Maternal Aunt     Social History Social History   Tobacco Use   Smoking status: Never   Smokeless tobacco: Never  Vaping Use   Vaping Use: Never used  Substance Use Topics   Alcohol use: Yes    Comment: 3-4 drinks 2-4 x per month   Drug use: Not Currently    Comment: Pt endorsed Marijuana use; UDS was clear     Allergies   Paliperidone   Review of Systems Review of Systems Per HPI  Physical Exam Triage Vital Signs ED Triage Vitals  Enc Vitals Group     BP 02/11/23 1509 131/83     Pulse Rate 02/11/23 1509 95     Resp 02/11/23 1509 18     Temp 02/11/23 1509 98 F (36.7 C)     Temp Source 02/11/23 1509 Oral     SpO2 02/11/23 1509 98 %     Weight 02/11/23 1510 280 lb (127 kg)     Height 02/11/23 1510 6' (1.829 m)     Head Circumference --      Peak Flow --  Pain Score 02/11/23 1510 4     Pain Loc --      Pain Edu? --      Excl. in GC? --    No data found.  Updated Vital Signs BP 131/83 (BP Location: Left Arm)   Pulse 95   Temp 98 F (36.7 C) (Oral)   Resp 18   Ht 6' (1.829 m)   Wt 280 lb (127 kg)   SpO2 98%   BMI 37.97 kg/m   Visual Acuity Right Eye Distance:   Left Eye Distance:   Bilateral Distance:    Right Eye Near:   Left Eye Near:    Bilateral Near:     Physical Exam Constitutional:      General: He is not in acute distress.    Appearance: Normal appearance. He is not toxic-appearing or diaphoretic.  HENT:     Head: Normocephalic and atraumatic.     Right Ear: Tympanic membrane and ear canal normal.     Left Ear: Tympanic membrane and ear canal normal.     Nose: No congestion.     Mouth/Throat:     Mouth: Mucous membranes are moist.     Pharynx: Posterior oropharyngeal erythema present. No oropharyngeal  exudate.     Tonsils: No tonsillar exudate or tonsillar abscesses. 1+ on the right. 1+ on the left.  Eyes:     Extraocular Movements: Extraocular movements intact.     Conjunctiva/sclera: Conjunctivae normal.     Pupils: Pupils are equal, round, and reactive to light.  Cardiovascular:     Rate and Rhythm: Normal rate and regular rhythm.     Pulses: Normal pulses.     Heart sounds: Normal heart sounds.  Pulmonary:     Effort: Pulmonary effort is normal. No respiratory distress.     Breath sounds: Normal breath sounds. No stridor. No wheezing, rhonchi or rales.  Abdominal:     General: Abdomen is flat. Bowel sounds are normal.     Palpations: Abdomen is soft.  Musculoskeletal:        General: Normal range of motion.     Cervical back: Normal range of motion.  Skin:    General: Skin is warm and dry.  Neurological:     General: No focal deficit present.     Mental Status: He is alert and oriented to person, place, and time. Mental status is at baseline.  Psychiatric:        Mood and Affect: Mood normal.        Behavior: Behavior normal.      UC Treatments / Results  Labs (all labs ordered are listed, but only abnormal results are displayed) Labs Reviewed  CULTURE, GROUP A STREP [facility])  POCT RAPID STREP A (OFFICE)  CYTOLOGY, (ORAL, ANAL, URETHRAL) ANCILLARY ONLY    EKG   Radiology No results found.  Procedures Procedures (including critical care time)  Medications Ordered in UC Medications - No data to display  Initial Impression / Assessment and Plan / UC Course  I have reviewed the triage vital signs and the nursing notes.  Pertinent labs & imaging results that were available during my care of the patient were reviewed by me and considered in my medical decision making (see chart for details).     Rapid strep was negative I am still highly suspicious of strep throat given appearance of throat on exam.  Will opt to treat with Augmentin antibiotic.  Throat  culture pending.  Cytology swab of the  throat pending.  Patient wishes to only test for gonorrhea and chlamydia with cytology swab.  Patient does not want any further STD testing.  Will await swab for any further treatment.  Advised strict follow up precautions if any symptoms persist or worsen.  Patient verbalized understanding and was agreeable with plan.  Augmentin originally prescribed for 7 days but pharmacy was called to change the duration to 10 days. Final Clinical Impressions(s) / UC Diagnoses   Final diagnoses:  Acute tonsillitis, unspecified etiology  Sore throat  Screening examination for venereal disease     Discharge Instructions      I have prescribed you an antibiotic for throat infection.  Throat culture and STD swab of throat is pending.  Will call if it is abnormal and treat if necessary.  Follow-up if any symptoms persist or worsen.    ED Prescriptions     Medication Sig Dispense Auth. Provider   amoxicillin-clavulanate (AUGMENTIN) 875-125 MG tablet Take 1 tablet by mouth every 12 (twelve) hours. 14 tablet [place], Acie Fredrickson, [place]      PDMP not reviewed this encounter.   Gustavus Bryant, [place] 02/11/23 1633    Gustavus Bryant, [place] 02/11/23 [phone]

## 2023-02-13 LAB — CULTURE, GROUP A STREP (THRC)

## 2023-02-14 LAB — CYTOLOGY, (ORAL, ANAL, URETHRAL) ANCILLARY ONLY
Chlamydia: NEGATIVE
Comment: NEGATIVE
Comment: NORMAL
Neisseria Gonorrhea: NEGATIVE

## 2023-02-17 DIAGNOSIS — Z7252 High risk homosexual behavior: Secondary | ICD-10-CM | POA: Diagnosis not present

## 2023-02-17 DIAGNOSIS — R519 Headache, unspecified: Secondary | ICD-10-CM | POA: Diagnosis not present

## 2023-02-21 DIAGNOSIS — E119 Type 2 diabetes mellitus without complications: Secondary | ICD-10-CM | POA: Diagnosis not present

## 2023-02-23 DIAGNOSIS — F3161 Bipolar disorder, current episode mixed, mild: Secondary | ICD-10-CM | POA: Diagnosis not present

## 2023-02-23 DIAGNOSIS — F1990 Other psychoactive substance use, unspecified, uncomplicated: Secondary | ICD-10-CM | POA: Diagnosis not present

## 2023-03-03 ENCOUNTER — Ambulatory Visit
Admission: RE | Admit: 2023-03-03 | Discharge: 2023-03-03 | Disposition: A | Discharge status: Home / Self Care | Payer: BC Managed Care – PPO | Source: Ambulatory Visit | Attending: Family Medicine | Admitting: Family Medicine

## 2023-03-03 ENCOUNTER — Other Ambulatory Visit: Payer: Self-pay

## 2023-03-03 VITALS — BP 106/74 | HR 98 | Temp 98.0°F | Resp 18 | Ht 72.0 in | Wt 280.0 lb

## 2023-03-03 DIAGNOSIS — J029 Acute pharyngitis, unspecified: Secondary | ICD-10-CM

## 2023-03-03 LAB — POCT RAPID STREP A (OFFICE): Rapid Strep A Screen: NEGATIVE

## 2023-03-03 MED ORDER — AZITHROMYCIN 250 MG PO TABS: ORAL_TABLET | ORAL | 0 refills | Status: DC

## 2023-03-03 NOTE — ED Triage Notes (Signed)
Patient c/o sore throat x 2 days.  Denies any cough or congestion.  Patient denies any OTC cold meds.

## 2023-03-03 NOTE — ED Provider Notes (Signed)
[facility]    CSN: [id number] Arrival date & time: 03/03/23  1921      History   Chief Complaint Chief Complaint  Patient presents with   Sore Throat    Entered by patient    HPI Kyle Mcmahon is a 27 y.o. male.   HPI Patient presents today with concern for recurrent strep.  Patient was seen here in clinic on 02/11/23 and treated for acute tonsillitis subsequently had a throat culture returned positive for beta-hemolytic strep.  Reports that he completed all of the Augmentin however symptoms never completely resolved.  Throat pain has worsened over the last 2 days.  He has no associated URI symptoms. He has attempted relief with over-the-counter cough and cold medications without relief.  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 Authorizing Provider  ABILIFY MAINTENA 300 MG PRSY prefilled syringe Inject into the muscle every 30 (thirty) days. 08/28/19  Yes [provider]  azithromycin (ZITHROMAX) 250 MG tablet Take 2 tabs PO x 1 dose, then 1 tab PO QD x 4 days 03/03/23  Yes Bing Neighbors, NP  lamoTRIgine (LAMICTAL) 150 MG tablet Take 150 mg by mouth 2 (two) times daily.   Yes [provider]  Semaglutide,0.25 or 0.5MG /DOS, 2 MG/1.5ML SOPN Inject 0.25 mg  into the skin once a week. 0.25 mg once weekly for 4 weeks then increase to 0.5 mg weekly for at least 4 weeks,max 1 mg 10/04/22  Yes Jerre Simon, MD  amoxicillin-clavulanate (AUGMENTIN) 875-125 MG tablet Take 1 tablet by mouth every 12 (twelve) hours. 02/11/23   Gustavus Bryant, FNP    Family History Family History  Problem Relation Age of Onset   Bipolar disorder Mother    Diabetes Mellitus II Paternal Grandfather    Diabetes Maternal Aunt     Social History Social History   Tobacco Use   Smoking status: Never   Smokeless tobacco: Never  Vaping Use   Vaping Use: Never used  Substance Use Topics   Alcohol use: Yes    Comment: 3-4 drinks 2-4 x per month   Drug use: Not Currently    Comment: Pt endorsed Marijuana use; UDS was clear     Allergies   Paliperidone   Review of Systems Review of Systems Pertinent negatives listed in HPI   Physical Exam  Updated Vital Signs BP 106/74 (BP Location: Left Arm)   Pulse 98   Temp 98 F (36.7 C) (Oral)   Resp 18   Ht 6' (1.829 m)   Wt 280 lb (127 kg)   SpO2 94%   BMI 37.97 kg/m   Visual Acuity Right Eye Distance:   Left Eye Distance:   Bilateral Distance:  Right Eye Near:   Left Eye Near:    Bilateral Near:     Physical Exam Vitals reviewed.  Constitutional:      Appearance: He is ill-appearing.  HENT:     Head: Normocephalic and atraumatic.     Right Ear: Tympanic membrane and ear canal normal.     Left Ear: Tympanic membrane and ear canal normal.     Nose: No congestion or rhinorrhea.     Mouth/Throat:     Pharynx: Pharyngeal swelling, posterior oropharyngeal erythema and uvula swelling present.     Tonsils: Tonsillar exudate present. No tonsillar abscesses. 2+ on the right. 3+ on the left.  Cardiovascular:     Rate and Rhythm: Normal rate and regular rhythm.  Pulmonary:     Effort: Pulmonary effort is normal.     Breath sounds: Normal breath sounds.  Lymphadenopathy:     Cervical: Cervical adenopathy  present.  Skin:    General: Skin is warm and dry.  Neurological:     General: No focal deficit present.     Mental Status: He is alert.      UC Treatments / Results  Labs (all labs ordered are listed, but only abnormal results are displayed) Labs Reviewed  POCT RAPID STREP A (OFFICE)    EKG   Radiology No results found.  Procedures Procedures (including critical care time)  Medications Ordered in UC Medications - No data to display  Initial Impression / Assessment and Plan / UC Course  I have reviewed the triage vital signs and the nursing notes.  Pertinent labs & imaging results that were available during my care of the patient were reviewed by me and considered in my medical decision making (see chart for details).    Rapid strep negative.  Based on evaluation patient appears to have recurrent tonsillitis.  Given he was recently treated with Augmentin, I  am treating to current tonsillitis infection with azithromycin. Patient recently tested positive for chlamydia on 02/19/2023 which he was treated with doxycycline x 7 days.  Patient advised to return if symptoms worsen or do not improve with treatment. Final Clinical Impressions(s) / UC Diagnoses   Final diagnoses:  Acute pharyngitis, unspecified etiology     Discharge Instructions      Treating you for possible recurrent strep infection.  Your rapid strep came back negative however on review see that you were recently treated with amoxicillin for strep.  Given that symptoms abruptly returned started azithromycin Start Azithromycin Take 2 tabs x 1 dose, then 1 tab every day for x 4 days.  Also after you have been on antibiotics for 24 hours change your toothbrush to prevent reinfection.     ED Prescriptions     Medication Sig Dispense Auth. Provider   azithromycin (ZITHROMAX) 250 MG tablet Take 2 tabs PO x 1 dose, then 1 tab PO QD x 4 days 6 tablet Bing Neighbors, NP      PDMP not reviewed this  encounter.   Bing Neighbors, NP 03/04/23 1057

## 2023-03-03 NOTE — Discharge Instructions (Signed)
Treating you for possible recurrent strep infection.  Your rapid strep came back negative however on review see that you were recently treated with amoxicillin for strep.  Given that symptoms abruptly returned started azithromycin Start Azithromycin Take 2 tabs x 1 dose, then 1 tab every day for x 4 days.  Also after you have been on antibiotics for 24 hours change your toothbrush to prevent reinfection.

## 2023-03-10 DIAGNOSIS — J029 Acute pharyngitis, unspecified: Secondary | ICD-10-CM | POA: Diagnosis not present

## 2023-03-18 ENCOUNTER — Encounter: Payer: Self-pay | Admitting: Family Medicine

## 2023-03-18 ENCOUNTER — Ambulatory Visit: Payer: BC Managed Care – PPO | Admitting: Family Medicine

## 2023-03-18 VITALS — BP 126/80 | HR 85 | Temp 98.2°F | Ht 72.0 in | Wt 275.4 lb

## 2023-03-18 DIAGNOSIS — J029 Acute pharyngitis, unspecified: Secondary | ICD-10-CM | POA: Diagnosis not present

## 2023-03-18 MED ORDER — PANTOPRAZOLE SODIUM 40 MG PO TBEC
40.0000 mg | DELAYED_RELEASE_TABLET | Freq: Every day | ORAL | 0 refills | Status: DC
Start: 2023-03-18 — End: 2024-01-11

## 2023-03-18 MED ORDER — FLUTICASONE PROPIONATE 50 MCG/ACT NA SUSP: 2.0000 | Freq: Every day | NASAL | 0 refills | Status: DC

## 2023-03-18 NOTE — Patient Instructions (Signed)
It was great to see you!  Our plans for today:  -  -   We are checking some labs today, we will release these results to your MyChart.  Take care and seek immediate care sooner if you develop any concerns.   Dr. Taim Wurm  

## 2023-03-18 NOTE — Progress Notes (Unsigned)
    SUBJECTIVE:   CHIEF COMPLAINT / HPI:   SORE THROAT - strep, COVID, mono, GC negative 6/27 Sore throat began *** days ago. Pain interferes with: *** Progression: *** Medications tried: *** Strep throat exposure: *** STD exposure: ***  Symptoms Fever: *** Cough: *** Runny nose: *** Muscle aches: *** Swollen Glands: *** Trouble breathing: *** Drooling: *** Weight loss: ***   Review of Symptoms - see HPI PMH - Smoking status noted.     OBJECTIVE:   BP 126/80   Pulse 85   Temp 98.2 F (36.8 C)   Ht 6' (1.829 m)   Wt 275 lb 6.4 oz (124.9 kg)   SpO2 98%   BMI 37.35 kg/m   ***  ASSESSMENT/PLAN:   No problem-specific Assessment & Plan notes found for this encounter.     Caro Laroche, DO

## 2023-03-18 NOTE — Progress Notes (Unsigned)
    SUBJECTIVE:   CHIEF COMPLAINT / HPI:   SORE THROAT - Zpak 6/20. - COVID, mono, strep, GC negative 6/27.  Sore throat began 1 month ago. Pain interferes with: *** Progression: a little better Medications tried: Zpak, amoxicillin Strep throat exposure: yes, treated. STD exposure: ***  Symptoms Fever: no Cough: no Runny nose: no Muscle aches: no Swollen Glands: no Trouble breathing: no Drooling: no Some pain with swallowing Weight loss: intentional    OBJECTIVE:   BP 126/80   Pulse 85   Temp 98.2 F (36.8 C)   Ht 6' (1.829 m)   Wt 275 lb 6.4 oz (124.9 kg)   SpO2 98%   BMI 37.35 kg/m   ***  ASSESSMENT/PLAN:   No problem-specific Assessment & Plan notes found for this encounter.     Kyle Laroche, DO

## 2023-03-20 NOTE — Assessment & Plan Note (Signed)
Initially with STI and strep, now s/p treatment and with negative repeat testing. Doubt new infection given lack of additional infectious symptoms and multiple negative infectious testing few days ago. Exam notable only for enlarged tonsils. Given worse with laying down and night, also obese, GERD may be contributing as does have a history of this. Will trial PPI. Also try flonase given some drainage and slight erythema on exam. F/u  in 4 weeks, if no better consider ENT referral.

## 2023-03-24 DIAGNOSIS — F3161 Bipolar disorder, current episode mixed, mild: Secondary | ICD-10-CM | POA: Diagnosis not present

## 2023-03-24 DIAGNOSIS — F4321 Adjustment disorder with depressed mood: Secondary | ICD-10-CM | POA: Diagnosis not present

## 2023-03-24 DIAGNOSIS — F1990 Other psychoactive substance use, unspecified, uncomplicated: Secondary | ICD-10-CM | POA: Diagnosis not present

## 2023-04-18 DIAGNOSIS — E119 Type 2 diabetes mellitus without complications: Secondary | ICD-10-CM | POA: Diagnosis not present

## 2023-05-18 DIAGNOSIS — J029 Acute pharyngitis, unspecified: Secondary | ICD-10-CM | POA: Diagnosis not present

## 2023-05-18 DIAGNOSIS — Z113 Encounter for screening for infections with a predominantly sexual mode of transmission: Secondary | ICD-10-CM | POA: Diagnosis not present

## 2023-05-23 DIAGNOSIS — F3161 Bipolar disorder, current episode mixed, mild: Secondary | ICD-10-CM | POA: Diagnosis not present

## 2023-05-23 DIAGNOSIS — F4321 Adjustment disorder with depressed mood: Secondary | ICD-10-CM | POA: Diagnosis not present

## 2023-05-23 DIAGNOSIS — F1990 Other psychoactive substance use, unspecified, uncomplicated: Secondary | ICD-10-CM | POA: Diagnosis not present

## 2023-07-18 DIAGNOSIS — F1990 Other psychoactive substance use, unspecified, uncomplicated: Secondary | ICD-10-CM | POA: Diagnosis not present

## 2023-07-18 DIAGNOSIS — F4321 Adjustment disorder with depressed mood: Secondary | ICD-10-CM | POA: Diagnosis not present

## 2023-07-18 DIAGNOSIS — F3161 Bipolar disorder, current episode mixed, mild: Secondary | ICD-10-CM | POA: Diagnosis not present

## 2023-08-29 DIAGNOSIS — L732 Hidradenitis suppurativa: Secondary | ICD-10-CM | POA: Diagnosis not present

## 2023-08-29 DIAGNOSIS — L0291 Cutaneous abscess, unspecified: Secondary | ICD-10-CM | POA: Diagnosis not present

## 2023-09-03 DIAGNOSIS — S61215A Laceration without foreign body of left ring finger without damage to nail, initial encounter: Secondary | ICD-10-CM | POA: Diagnosis not present

## 2023-09-03 DIAGNOSIS — Z23 Encounter for immunization: Secondary | ICD-10-CM | POA: Diagnosis not present

## 2023-09-12 DIAGNOSIS — F4321 Adjustment disorder with depressed mood: Secondary | ICD-10-CM | POA: Diagnosis not present

## 2023-09-12 DIAGNOSIS — F1990 Other psychoactive substance use, unspecified, uncomplicated: Secondary | ICD-10-CM | POA: Diagnosis not present

## 2023-09-12 DIAGNOSIS — F3161 Bipolar disorder, current episode mixed, mild: Secondary | ICD-10-CM | POA: Diagnosis not present

## 2023-09-16 DIAGNOSIS — E119 Type 2 diabetes mellitus without complications: Secondary | ICD-10-CM | POA: Diagnosis not present

## 2023-09-16 DIAGNOSIS — F4321 Adjustment disorder with depressed mood: Secondary | ICD-10-CM | POA: Diagnosis not present

## 2023-09-16 DIAGNOSIS — F1990 Other psychoactive substance use, unspecified, uncomplicated: Secondary | ICD-10-CM | POA: Diagnosis not present

## 2023-09-16 DIAGNOSIS — F3161 Bipolar disorder, current episode mixed, mild: Secondary | ICD-10-CM | POA: Diagnosis not present

## 2023-09-19 DIAGNOSIS — F1990 Other psychoactive substance use, unspecified, uncomplicated: Secondary | ICD-10-CM | POA: Diagnosis not present

## 2023-09-19 DIAGNOSIS — F4321 Adjustment disorder with depressed mood: Secondary | ICD-10-CM | POA: Diagnosis not present

## 2023-09-19 DIAGNOSIS — F3161 Bipolar disorder, current episode mixed, mild: Secondary | ICD-10-CM | POA: Diagnosis not present

## 2023-09-22 DIAGNOSIS — F4321 Adjustment disorder with depressed mood: Secondary | ICD-10-CM | POA: Diagnosis not present

## 2023-09-22 DIAGNOSIS — F3161 Bipolar disorder, current episode mixed, mild: Secondary | ICD-10-CM | POA: Diagnosis not present

## 2023-09-22 DIAGNOSIS — F1990 Other psychoactive substance use, unspecified, uncomplicated: Secondary | ICD-10-CM | POA: Diagnosis not present

## 2023-09-27 DIAGNOSIS — L732 Hidradenitis suppurativa: Secondary | ICD-10-CM | POA: Diagnosis not present

## 2023-09-28 DIAGNOSIS — F3161 Bipolar disorder, current episode mixed, mild: Secondary | ICD-10-CM | POA: Diagnosis not present

## 2023-09-28 DIAGNOSIS — F4321 Adjustment disorder with depressed mood: Secondary | ICD-10-CM | POA: Diagnosis not present

## 2023-09-28 DIAGNOSIS — F1990 Other psychoactive substance use, unspecified, uncomplicated: Secondary | ICD-10-CM | POA: Diagnosis not present

## 2023-10-19 DIAGNOSIS — F4321 Adjustment disorder with depressed mood: Secondary | ICD-10-CM | POA: Diagnosis not present

## 2023-10-19 DIAGNOSIS — F1990 Other psychoactive substance use, unspecified, uncomplicated: Secondary | ICD-10-CM | POA: Diagnosis not present

## 2023-10-19 DIAGNOSIS — F3161 Bipolar disorder, current episode mixed, mild: Secondary | ICD-10-CM | POA: Diagnosis not present

## 2023-10-20 DIAGNOSIS — F3132 Bipolar disorder, current episode depressed, moderate: Secondary | ICD-10-CM | POA: Diagnosis not present

## 2023-11-08 DIAGNOSIS — F4321 Adjustment disorder with depressed mood: Secondary | ICD-10-CM | POA: Diagnosis not present

## 2023-11-08 DIAGNOSIS — F1990 Other psychoactive substance use, unspecified, uncomplicated: Secondary | ICD-10-CM | POA: Diagnosis not present

## 2023-11-08 DIAGNOSIS — F3161 Bipolar disorder, current episode mixed, mild: Secondary | ICD-10-CM | POA: Diagnosis not present

## 2023-11-18 DIAGNOSIS — F3132 Bipolar disorder, current episode depressed, moderate: Secondary | ICD-10-CM | POA: Diagnosis not present

## 2024-01-06 DIAGNOSIS — F4321 Adjustment disorder with depressed mood: Secondary | ICD-10-CM | POA: Diagnosis not present

## 2024-01-06 DIAGNOSIS — F1990 Other psychoactive substance use, unspecified, uncomplicated: Secondary | ICD-10-CM | POA: Diagnosis not present

## 2024-01-06 DIAGNOSIS — F3161 Bipolar disorder, current episode mixed, mild: Secondary | ICD-10-CM | POA: Diagnosis not present

## 2024-01-09 DIAGNOSIS — F4321 Adjustment disorder with depressed mood: Secondary | ICD-10-CM | POA: Diagnosis not present

## 2024-01-09 DIAGNOSIS — F413 Other mixed anxiety disorders: Secondary | ICD-10-CM | POA: Diagnosis not present

## 2024-01-09 DIAGNOSIS — F3161 Bipolar disorder, current episode mixed, mild: Secondary | ICD-10-CM | POA: Diagnosis not present

## 2024-01-09 DIAGNOSIS — F1990 Other psychoactive substance use, unspecified, uncomplicated: Secondary | ICD-10-CM | POA: Diagnosis not present

## 2024-01-11 ENCOUNTER — Emergency Department (HOSPITAL_COMMUNITY)
Admission: EM | Admit: 2024-01-11 | Discharge: 2024-01-12 | Disposition: A | Discharge status: Other Acute Inpatient Hospital | Attending: Emergency Medicine | Admitting: Emergency Medicine

## 2024-01-11 DIAGNOSIS — F309 Manic episode, unspecified: Secondary | ICD-10-CM | POA: Diagnosis not present

## 2024-01-11 DIAGNOSIS — R45851 Suicidal ideations: Secondary | ICD-10-CM

## 2024-01-11 DIAGNOSIS — R000 Tachycardia, unspecified: Secondary | ICD-10-CM | POA: Diagnosis not present

## 2024-01-11 DIAGNOSIS — F1210 Cannabis abuse, uncomplicated: Secondary | ICD-10-CM

## 2024-01-11 DIAGNOSIS — F3110 Bipolar disorder, current episode manic without psychotic features, unspecified: Secondary | ICD-10-CM | POA: Diagnosis present

## 2024-01-11 DIAGNOSIS — F312 Bipolar disorder, current episode manic severe with psychotic features: Secondary | ICD-10-CM | POA: Diagnosis not present

## 2024-01-11 DIAGNOSIS — F413 Other mixed anxiety disorders: Secondary | ICD-10-CM | POA: Diagnosis not present

## 2024-01-11 DIAGNOSIS — F3161 Bipolar disorder, current episode mixed, mild: Secondary | ICD-10-CM | POA: Diagnosis not present

## 2024-01-11 DIAGNOSIS — F3010 Manic episode without psychotic symptoms, unspecified: Secondary | ICD-10-CM

## 2024-01-11 DIAGNOSIS — F1990 Other psychoactive substance use, unspecified, uncomplicated: Secondary | ICD-10-CM | POA: Diagnosis not present

## 2024-01-11 DIAGNOSIS — F4321 Adjustment disorder with depressed mood: Secondary | ICD-10-CM | POA: Diagnosis not present

## 2024-01-11 LAB — CBC WITH DIFFERENTIAL/PLATELET
Abs Immature Granulocytes: 0.01 10*3/uL (ref 0.00–0.07)
Basophils Absolute: 0.1 10*3/uL (ref 0.0–0.1)
Basophils Relative: 1 %
Eosinophils Absolute: 0 10*3/uL (ref 0.0–0.5)
Eosinophils Relative: 0 %
HCT: 41.6 % (ref 39.0–52.0)
Hemoglobin: 13.5 g/dL (ref 13.0–17.0)
Immature Granulocytes: 0 %
Lymphocytes Relative: 53 %
Lymphs Abs: 5.2 10*3/uL — ABNORMAL HIGH (ref 0.7–4.0)
MCH: 26.9 pg (ref 26.0–34.0)
MCHC: 32.5 g/dL (ref 30.0–36.0)
MCV: 83 fL (ref 80.0–100.0)
Monocytes Absolute: 0.8 10*3/uL (ref 0.1–1.0)
Monocytes Relative: 8 %
Neutro Abs: 3.8 10*3/uL (ref 1.7–7.7)
Neutrophils Relative %: 38 %
Platelets: 334 10*3/uL (ref 150–400)
RBC: 5.01 MIL/uL (ref 4.22–5.81)
RDW: 13.2 % (ref 11.5–15.5)
WBC: 10 10*3/uL (ref 4.0–10.5)
nRBC: 0 % (ref 0.0–0.2)

## 2024-01-11 LAB — COMPREHENSIVE METABOLIC PANEL WITH GFR
ALT: 24 U/L (ref 0–44)
AST: 31 U/L (ref 15–41)
Albumin: 4.1 g/dL (ref 3.5–5.0)
Alkaline Phosphatase: 44 U/L (ref 38–126)
Anion gap: 13 (ref 5–15)
BUN: 14 mg/dL (ref 6–20)
CO2: 21 mmol/L — ABNORMAL LOW (ref 22–32)
Calcium: 9.2 mg/dL (ref 8.9–10.3)
Chloride: 103 mmol/L (ref 98–111)
Creatinine, Ser: 1.1 mg/dL (ref 0.61–1.24)
GFR, Estimated: >60 mL/min (ref >60)
Glucose, Bld: 115 mg/dL — ABNORMAL HIGH (ref 70–99)
Potassium: 3.3 mmol/L — ABNORMAL LOW (ref 3.5–5.1)
Sodium: 137 mmol/L (ref 135–145)
Total Bilirubin: 1.3 mg/dL — ABNORMAL HIGH (ref 0.0–1.2)
Total Protein: 7.3 g/dL (ref 6.5–8.1)

## 2024-01-11 LAB — RAPID URINE DRUG SCREEN, HOSP PERFORMED
Amphetamines: NOT DETECTED
Barbiturates: NOT DETECTED
Benzodiazepines: POSITIVE — AB
Cocaine: NOT DETECTED
Opiates: NOT DETECTED
Tetrahydrocannabinol: POSITIVE — AB

## 2024-01-11 LAB — ETHANOL: Alcohol, Ethyl (B): <15 mg/dL (ref <15)

## 2024-01-11 MED ORDER — ZIPRASIDONE MESYLATE 20 MG IM SOLR
20.0000 mg | Freq: Once | INTRAMUSCULAR | Status: AC
  Administered 2024-01-11: 20 mg via INTRAMUSCULAR
  Filled 2024-01-11: qty 20

## 2024-01-11 MED ORDER — STERILE WATER FOR INJECTION IJ SOLN
INTRAMUSCULAR | Status: AC
  Administered 2024-01-11: 1.2 mL
  Filled 2024-01-11: qty 10

## 2024-01-11 MED ORDER — LORAZEPAM 1 MG PO TABS
2.0000 mg | ORAL_TABLET | Freq: Once | ORAL | Status: AC
  Administered 2024-01-11: 2 mg via ORAL
  Filled 2024-01-11: qty 2

## 2024-01-11 NOTE — BH Assessment (Signed)
 This patient was referred to [facility] telecare for his teleassessment.  [place] with [facility] will reach out with a time and provider to see this patient.

## 2024-01-11 NOTE — ED Notes (Addendum)
Pt sleeping - respirations even and unlabored

## 2024-01-11 NOTE — ED Notes (Signed)
 IVC'd 01/11/24, exp 01/18/24; IVC docs in [facility] [employer]; all copies made for clipboard, medical records, etc.

## 2024-01-11 NOTE — ED Notes (Signed)
 Please give an update to Genevia Kern (mother), number under emergency contacts

## 2024-01-11 NOTE — ED Notes (Signed)
 IVC CASE # SENT TO ME BY [facility] [place] [phone]

## 2024-01-11 NOTE — ED Provider Notes (Signed)
 You may but have [facility] Provider Note   CSN: [id number] Arrival date & time: 01/11/24  1143     History  Chief Complaint  Patient presents with   Manic Behavior    Kyle Mcmahon is a 28 y.o. male.  HPI Patient history of bipolar disorder.  Has psychiatrist.  Has been taking Seroquel and Ativan .  Reportedly stated this more he was going to kill himself.  Not been a sleeping.  Reportedly pace around the room and pulled his pants down from his mother.  Patient will not provide any history for me.  He is agitated.    Home Medications Prior to Admission medications   Medication Sig Start Date End Date Taking? Authorizing Provider  ABILIFY  MAINTENA 300 MG PRSY prefilled syringe Inject into the muscle every 30 (thirty) days. 08/28/19   [provider]  amoxicillin -clavulanate (AUGMENTIN ) 875-125 MG tablet Take 1 tablet by mouth every 12 (twelve) hours. 02/11/23   Dodson Freestone, FNP  azithromycin  (ZITHROMAX ) 250 MG tablet Take 2 tabs PO x 1 dose, then 1 tab PO QD x 4 days 03/03/23   Buena Carmine, NP  fluticasone  (FLONASE ) 50 MCG/ACT nasal spray Place 2 sprays into both nostrils daily. 03/18/23   Rumball, Alison M, DO  lamoTRIgine  (LAMICTAL ) 150 MG tablet Take 150 mg by mouth 2 (two) times daily.    [provider]  pantoprazole  (PROTONIX ) 40 MG tablet Take 1 tablet (40 mg total) by mouth daily. 03/18/23   Rumball, Alison M, DO  Semaglutide ,0.25 or 0.5MG /DOS, 2 MG/1.5ML SOPN Inject 0.25 mg into the skin once a week. 0.25 mg once weekly for 4 weeks then increase to 0.5 mg weekly for at least 4 weeks,max 1 mg 10/04/22   Goble Last, MD      Allergies    Paliperidone     Review of Systems   Review of Systems  Physical Exam Updated Vital Signs BP 117/63   Pulse (!) 106   Temp 98.9 F (37.2 C)   Resp 18   SpO2 97%  Physical Exam Vitals and nursing note reviewed.  Neurological:     Mental Status: He is alert.   Psychiatric:     Comments: Patient pressured and agitated.  Awake and moving all extremities.     ED Results / Procedures / Treatments   Labs (all labs ordered are listed, but only abnormal results are displayed) Labs Reviewed  COMPREHENSIVE METABOLIC PANEL WITH GFR - Abnormal; Notable for the following components:      Result Value   Potassium 3.3 (*)    CO2 21 (*)    Glucose, Bld 115 (*)    Total Bilirubin 1.3 (*)    All other components within normal limits  CBC WITH DIFFERENTIAL/PLATELET - Abnormal; Notable for the following components:   Lymphs Abs 5.2 (*)    All other components within normal limits  ETHANOL  RAPID URINE DRUG SCREEN, HOSP PERFORMED    EKG None  Radiology No results found.  Procedures Procedures    Medications Ordered in ED Medications  ziprasidone (GEODON) injection 20 mg (20 mg Intramuscular Given 01/11/24 1236)  sterile water (preservative free) injection (1.2 mLs  Given 01/11/24 1259)    ED Course/ Medical Decision Making/ A&P                                 Medical Decision Making  Amount and/or Complexity of Data Reviewed Labs: ordered.  Risk Prescription drug management.   Patient with history of bipolar disorder.  Agitated.  No relief with medications as an outpatient.  Has been worsening.  Reportedly said he was going to kill himself.  Unable to provide much history and is agitated.  Given Geodon here for sedation.  IVC filled out.  Patient more calm after Geodon.  Reviewed letter from patient's psychiatrist, Dr. Blaise Bumps.  They recommend inpatient treatment.        Final Clinical Impression(s) / ED Diagnoses Final diagnoses:  Manic behavior [facility])    Rx / DC Orders ED Discharge Orders     None         Mozell Arias, MD 01/11/24 1511

## 2024-01-11 NOTE — ED Notes (Signed)
 Patient's mom called requesting results of labs; RN advised lab can not be given over the phone; mother states she will come in person tomorrow to get results-Monique,RN

## 2024-01-11 NOTE — ED Triage Notes (Signed)
 Pt BIB mother for manic behaviors. Pt face time with psychiatrist Dr. Blaise Bumps and was told to take Seroquel and ativan  and come to ER. Pt took medication and mother brought pt here. Pt stated this morning that he wanted to kill himself. Pt was found this morning naked outside and taken home by police. Pt states he hasn't not been eating or sleeping much the past few days and has been smoking weed. Pt refuses to have blood pressure taken. Pt pacing around room, pulled pants down in front of mother. Garbled speech. Denies missing any medication. Pt refusing to stay in room.

## 2024-01-11 NOTE — ED Notes (Signed)
 Lunch tray ordered

## 2024-01-11 NOTE — ED Notes (Signed)
 IVC'd 01/11/24, exp 01/18/24; IVC docs [place] [facility]

## 2024-01-12 ENCOUNTER — Encounter (HOSPITAL_COMMUNITY): Payer: Self-pay | Admitting: Psychiatry

## 2024-01-12 DIAGNOSIS — K2970 Gastritis, unspecified, without bleeding: Secondary | ICD-10-CM | POA: Diagnosis not present

## 2024-01-12 DIAGNOSIS — F3164 Bipolar disorder, current episode mixed, severe, with psychotic features: Secondary | ICD-10-CM | POA: Diagnosis not present

## 2024-01-12 DIAGNOSIS — F3110 Bipolar disorder, current episode manic without psychotic features, unspecified: Secondary | ICD-10-CM | POA: Diagnosis not present

## 2024-01-12 DIAGNOSIS — F1210 Cannabis abuse, uncomplicated: Secondary | ICD-10-CM

## 2024-01-12 DIAGNOSIS — R45851 Suicidal ideations: Secondary | ICD-10-CM | POA: Diagnosis not present

## 2024-01-12 DIAGNOSIS — H6991 Unspecified Eustachian tube disorder, right ear: Secondary | ICD-10-CM | POA: Diagnosis not present

## 2024-01-12 DIAGNOSIS — R000 Tachycardia, unspecified: Secondary | ICD-10-CM | POA: Diagnosis not present

## 2024-01-12 DIAGNOSIS — F312 Bipolar disorder, current episode manic severe with psychotic features: Secondary | ICD-10-CM | POA: Diagnosis not present

## 2024-01-12 DIAGNOSIS — E119 Type 2 diabetes mellitus without complications: Secondary | ICD-10-CM | POA: Diagnosis not present

## 2024-01-12 DIAGNOSIS — Z8659 Personal history of other mental and behavioral disorders: Secondary | ICD-10-CM | POA: Diagnosis not present

## 2024-01-12 DIAGNOSIS — R4581 Low self-esteem: Secondary | ICD-10-CM | POA: Diagnosis not present

## 2024-01-12 DIAGNOSIS — F19959 Other psychoactive substance use, unspecified with psychoactive substance-induced psychotic disorder, unspecified: Secondary | ICD-10-CM | POA: Diagnosis not present

## 2024-01-12 DIAGNOSIS — F309 Manic episode, unspecified: Secondary | ICD-10-CM | POA: Diagnosis not present

## 2024-01-12 MED ORDER — ZIPRASIDONE MESYLATE 20 MG IM SOLR: 20.0000 mg | Freq: Four times a day (QID) | INTRAMUSCULAR | Status: DC | PRN

## 2024-01-12 MED ORDER — LAMOTRIGINE 25 MG PO TABS
25.0000 mg | ORAL_TABLET | Freq: Every day | ORAL | Status: DC
Start: 2024-01-12 — End: 2024-01-12
  Administered 2024-01-12: 25 mg via ORAL
  Filled 2024-01-12: qty 1

## 2024-01-12 MED ORDER — LAMOTRIGINE 25 MG PO TABS: 50.0000 mg | ORAL_TABLET | Freq: Every day | ORAL | Status: DC

## 2024-01-12 MED ORDER — LAMOTRIGINE 100 MG PO TABS
100.0000 mg | ORAL_TABLET | Freq: Every day | ORAL | Status: DC
Start: 2024-02-09 — End: 2024-01-12

## 2024-01-12 MED ORDER — QUETIAPINE FUMARATE 200 MG PO TABS: 300.0000 mg | ORAL_TABLET | Freq: Every day | ORAL | Status: DC

## 2024-01-12 MED ORDER — LORAZEPAM 1 MG PO TABS
2.0000 mg | ORAL_TABLET | Freq: Four times a day (QID) | ORAL | Status: DC | PRN
  Administered 2024-01-12: 2 mg via ORAL
  Filled 2024-01-12: qty 2

## 2024-01-12 NOTE — Progress Notes (Signed)
 LCSW Progress Note  [id number]   Kyle Mcmahon  01/12/2024  8:49 AM  Description:   Inpatient Psychiatric Referral  Patient was recommended inpatient per [place]  Sulema Endo . There are no available beds at [facility], per [facility] ). Patient was referred to the following out of network facilities:   Destination  Service Provider Address Phone Fax  [facility] [address] [place], [place] [address] [phone]  [facility] [address] [place] [address] [phone]  [facility] [address] [place] [address] [phone]  [facility] [address], [place] [address] [phone]  [facility] [address], [place] [address] [phone]  [facility] [place] [phone]  [facility] [address] [place] [address] [phone]  [facility] EFAX [address] [place], [place] [phone]  [facility] [address], [place] [address] [phone]      Situation ongoing, CSW to continue following and update chart as more information becomes available.     Kyle Mcmahon, MSW, LCSW  01/12/2024 8:49 AM

## 2024-01-12 NOTE — Consult Note (Signed)
 Kyle Mcmahon  Patient Name: Kyle Mcmahon MRN: [id number] DOB: 1996/07/22 DATE OF Consult: 01/12/2024  PRIMARY PSYCHIATRIC DIAGNOSES  1.  Bipolar I D/O mre mania 2.  Cannabis Use D/O 3.  SI  RECOMMENDATIONS  Inpt psych admission recommended:    [x] YES       []  NO   If yes:       [x]   Pt meets involuntary commitment criteria if not voluntary       []    Pt does not meet involuntary commitment criteria and must be         voluntary. If patient is not voluntary, then discharge is recommended.   Medication recommendations:  reorder home lamotrigine  will restart at 25mg  po bedtime and titrate to efficacy due to being off the medication; quetiapine  300mg  po bedtime for mood  Please ensure K> 4, Mg> 2 and Qtc < 500 when using antipsychotics. Monitor for extrapyramidal syndrome (EPS) such as dystonia, akathisia, and tardive dyskinesia Monitor: weight, appetite, sedation, behavior   Non-Medication recommendations:  maintain safety obs    Communication: Treatment team members (and family members if applicable) who were involved in treatment/care discussions and planning, and with whom we spoke or engaged with via secure text/chat, include the following: epic chat Nurse Kyle Mcmahon, Dr. Val Mcmahon, Kyle Mcmahon Counselor   I have discussed my assessment and treatment recommendations with the patient. Possible medication side effects/risks/benefits of current regimen.   Importance of medication adherence for medication to be beneficial.   Follow-Up Telepsychiatry C/L services:            []  We will continue to follow this patient with you.             [x]  Will sign off for now. Please re-consult our service as necessary.  Thank you for involving us  in the care of this patient. If you have any additional questions or concerns, please call [phone] and ask for me or the provider on-call.  TELEPSYCHIATRY ATTESTATION & CONSENT  As the provider for this telehealth consult, I  attest that I verified the patient's identity using two separate identifiers, introduced myself to the patient, provided my credentials, disclosed my location, and performed this encounter via a HIPAA-compliant, real-time, face-to-face, two-way, interactive audio and video platform and with the full consent and agreement of the patient (or guardian as applicable.)  Patient physical location: [facility] ED. Telehealth provider physical location: home office in state of [place]  Video start time: 03:53am  (Central Time) Video end time: 04:05am (Central Time)  IDENTIFYING DATA  Kyle Mcmahon is a 28 y.o. year-old male for whom a psychiatric consultation has been ordered by the primary provider. The patient was identified using two separate identifiers.  CHIEF COMPLAINT/REASON FOR CONSULT  "I have bipolar disorder, I had a manic episode"   HISTORY OF PRESENT ILLNESS (HPI)  The patient is IVC for making SI statements; was found outside naked.    He reports he was having a "manic" episode, reports had  missed some doses of medication, was using cannabis, and was sleep deprived.  He reports feeling much better now that he has some sleep and medication while in ED.  He was administered lorazepam  and ziprasidone . However, nursing staff reports he has only slept for one hour, he remains manic, has been pacing, bothering staff every 5 minutes; hyperactive, impulsive, poor insight/judgement, elevated/labile mood, grandiose poor historian, questionable reliability  Hx of treatment for  Bipolar D/O, Cannabis use d/o  Currently  prescribed: aripiprazole  asimtufii last given this past Friday per his report; lamotrigine , lorazepam , quetiapine  He has missed doses of his medication;  reports he forgot to take them first night, then "got lazy taking them, took a break from them".   Today, client denied symptoms of depression with anergia, anhedonia, amotivation, no anxiety,  no reported panic symptoms, no reported  obsessive/compulsive behaviors. Client denies active SI/HI ideations, plans or intent. There is no evidence of psychosis or delusional thinking.  Denied current AV hallucinations;   sleeping 3-4hrs/24hrs, appetite decreased No self-harm behaviors. Reviewed active medication list/reviewed labs. Obtained Collateral information from medical record. EKG  QtC 447  PAST PSYCHIATRIC HISTORY    Previous Psychiatric Hospitalizations: multiple Outpt treatment:  has psychiatrist Previous psychotropic medication trials: invega  sustenna depakote  benztropine  haldol,  Previous mental health diagnosis per client/MEDICAL RECORD NUMBERbipolar I   Suicide attempts/self-injurious behaviors:  denied history of suicidal/homicidal ideation/gestures; denied history of self-harm behaviors   PAST MEDICAL HISTORY  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   QUEtiapine  (SEROQUEL ) 300 MG tablet Take 300 mg by mouth at bedtime.     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      Cannabis daily use; obtains from street Reports social alcohol  use      FAMILY HISTORY  Family History  Problem Relation Age of Onset   Bipolar disorder Mother    Diabetes Mellitus II Paternal Grandfather    Diabetes Maternal Aunt    Family Psychiatric History (if known):  per record review mother diagnosed with bipolar, maternal grandmother diagnosed with bipolar, and maternal grandfather committed suicide.   MENTAL STATUS EXAM (MSE)  Mental Status Exam: General Appearance: Casual  Orientation:  Full (Time, Place, and Person)  Memory:  Immediate;   Fair Recent;   Fair Remote;   Good  Concentration:  Concentration: Fair  Recall:  Good  Attention  fair  Eye Contact:  Good  Speech:  Pressured  Language:  Good  Volume:  Normal  Mood: elated  Affect:  Appropriate  Thought Process:  Goal Directed  Thought Content:  Rumination  Suicidal Thoughts:  No  Homicidal Thoughts:  No  Judgement:  Impaired  Insight:  Lacking  Psychomotor Activity:  Increased  Akathisia:  Negative  Fund of Knowledge:  Good    Assets:  Communication Skills Housing Social Support  Cognition:  WNL  ADL's:  Intact  AIMS (if indicated):       VITALS  Blood pressure (!) 102/56, pulse  86, temperature 97.9 F (36.6 C), temperature source Oral, resp. rate 17, SpO2 100%.  LABS  Admission on 01/11/2024  Component Date Value Ref Range Status   Sodium 01/11/2024 137  135 - 145 mmol/L Final   Potassium 01/11/2024 3.3 (L)  3.5 - 5.1 mmol/L Final   Chloride 01/11/2024 103  98 - 111 mmol/L Final   CO2 01/11/2024 21 (L)  22 - 32 mmol/L Final   Glucose, Bld 01/11/2024 115 (H)  70 - 99 mg/dL Final   Glucose reference range applies only to samples taken after fasting for at least 8 hours.   BUN 01/11/2024 14  6 - 20 mg/dL Final   Creatinine, Ser 01/11/2024 1.10  0.61 - 1.24 mg/dL Final   Calcium 16/06/9603 9.2  8.9 - 10.3 mg/dL Final   Total Protein 54/05/8118 7.3  6.5 - 8.1 g/dL Final   Albumin  14/78/2956 4.1  3.5 - 5.0 g/dL Final   AST 21/30/8657 31  15 - 41 U/L Final   ALT 01/11/2024 24  0 - 44 U/L Final   Alkaline Phosphatase 01/11/2024 44  38 - 126 U/L Final   Total Bilirubin 01/11/2024 1.3 (H)  0.0 - 1.2 mg/dL Final   GFR, Estimated 01/11/2024 >60  >60 mL/min Final   Comment: (Mcmahon) Calculated using the CKD-EPI Creatinine Equation (2021)    Anion gap 01/11/2024 13  5 - 15 Final   Performed at [facility], [address] [place] [address] [place], [place] [address]   Alcohol , Ethyl (B) 01/11/2024 <15  <15 mg/dL Final   Comment: Please Mcmahon change in reference range. (Mcmahon) For medical purposes only. Performed at [facility], [address] [place], [place] [address]    Opiates 01/11/2024 NONE DETECTED  NONE DETECTED Final   Cocaine 01/11/2024 NONE DETECTED  NONE DETECTED Final   Benzodiazepines 01/11/2024 POSITIVE (A)  NONE DETECTED Final   Amphetamines 01/11/2024 NONE DETECTED  NONE DETECTED Final   Tetrahydrocannabinol 01/11/2024 POSITIVE (A)  NONE DETECTED Final   Barbiturates 01/11/2024 NONE DETECTED  NONE DETECTED Final   Comment: (Mcmahon) DRUG SCREEN FOR MEDICAL PURPOSES ONLY.  IF CONFIRMATION IS NEEDED FOR ANY PURPOSE, NOTIFY LAB WITHIN 5 DAYS.  LOWEST DETECTABLE LIMITS FOR URINE DRUG SCREEN Drug Class                     Cutoff (ng/mL) Amphetamine and metabolites    1000 Barbiturate and metabolites    200 Benzodiazepine                 200 Opiates and metabolites        300 Cocaine and metabolites        300 THC                            50 Performed at [facility], [address], [place], [place] [address]    WBC 01/11/2024 10.0  4.0 - 10.5 K/uL Final   RBC 01/11/2024 5.01  4.22 - 5.81 MIL/uL Final   Hemoglobin 01/11/2024 13.5  13.0 - 17.0 g/dL Final   HCT 40/06/2724 41.6  39.0 - 52.0 % Final   MCV 01/11/2024 83.0  80.0 - 100.0 fL Final   MCH 01/11/2024 26.9  26.0 - 34.0 pg Final   MCHC 01/11/2024 32.5  30.0 - 36.0 g/dL Final   RDW  36/64/4034 13.2  11.5 - 15.5 % Final   Platelets 01/11/2024  334  150 - 400 K/uL Final   nRBC 01/11/2024 0.0  0.0 - 0.2 % Final   Neutrophils Relative % 01/11/2024 38  % Final   Neutro Abs 01/11/2024 3.8  1.7 - 7.7 K/uL Final   Lymphocytes Relative 01/11/2024 53  % Final   Lymphs Abs 01/11/2024 5.2 (H)  0.7 - 4.0 K/uL Final   Monocytes Relative 01/11/2024 8  % Final   Monocytes Absolute 01/11/2024 0.8  0.1 - 1.0 K/uL Final   Eosinophils Relative 01/11/2024 0  % Final   Eosinophils Absolute 01/11/2024 0.0  0.0 - 0.5 K/uL Final   Basophils Relative 01/11/2024 1  % Final   Basophils Absolute 01/11/2024 0.1  0.0 - 0.1 K/uL Final   Immature Granulocytes 01/11/2024 0  % Final   Abs Immature Granulocytes 01/11/2024 0.01  0.00 - 0.07 K/uL Final   Performed at [facility], [address] [place], [place] [address]    PSYCHIATRIC REVIEW OF SYSTEMS (ROS)  Depression:      [x]  Denies all symptoms of depression [] Depressed mood       [] Insomnia/hypersomnia              [] Fatigue        [] Change in appetite     [] Anhedonia                                [] Difficulty concentrating      [] Hopelessness             [] Worthlessness [] Guilt/shame                [] Psychomotor agitation/retardation   Mania:     [] Denies all symptoms of mania [x] Elevated mood           [] Irritability         [x] Pressured speech         [x]  Grandiosity         [x]  Decreased need for sleep                                                 [x] Increased energy          [x]  Increase in goal directed activity                                       [] Flight of ideas    [x]  Excessive involvement in high-risk behaviors                   [x]  Distractibility     Psychosis:     [x] Denies all symptoms of psychosis [] Paranoia         []  Auditory Hallucinations          [] Visual hallucinations         [] ELOC        [] IOR                [] Delusions   Suicide:    [x]  Denies SI/plan/intent []  Passive SI         []   Active SI          [] Plan           [] Intent  Homicide:  [x]   Denies HI/plan/intent []  Passive HI         []  Active HI         [] Plan            [] Intent           [] Identified Target    Additional findings:      Musculoskeletal: No abnormal movements observed      Gait & Station: Normal      Pain Screening: Denies      Nutrition & Dental Concerns: none reported  RISK FORMULATION/ASSESSMENT  Is the patient experiencing any suicidal or homicidal ideations: No       Explain if yes: denied to this provider, reported making statements earlier upon presentation  Protective factors considered for safety management:    Access to adequate health care Sense of responsibility Positive social support: Positive therapeutic relationship Future oriented Suicide Inquiry:  Denies suicidal ideations, intentions, or plans.  Denies  recent self-harm behavior. Talks futuristically.  Risk factors/concerns considered for safety management:  Family history of suicide Substance abuse/dependence Access to lethal means Impulsivity Aggression Unwillingness to seek help Male gender Unmarried  Is there a safety management plan with the patient and treatment team to minimize risk factors and promote protective factors: Yes           Explain: safety obs due to mania symptoms; impulsive, poor insight, poor judgement Is crisis care placement or psychiatric hospitalization recommended: Yes     Based on my current evaluation and risk assessment, patient is determined at this time to be at:  High risk  *RISK ASSESSMENT Risk assessment is a dynamic process; it is possible that this patient's condition, and risk level, may change. This should be re-evaluated and managed over time as appropriate. Please re-consult psychiatric consult services if additional assistance is needed in terms of risk assessment and management. If your team decides to discharge this patient, please advise the patient how to best access emergency  psychiatric services, or to call 911, if their condition worsens or they feel unsafe in any way.  Total time spent in this encounter was 60 minutes with greater than 50% of time spent in counseling and coordination of care.     Dr. Nilsa Bash, PhD, MSN, APRN, PMHNP-BC, [facility] Osei Anger  Ainsley Alfred, NP Telepsychiatry Consult Services

## 2024-01-12 NOTE — ED Notes (Signed)
 Patient is pacing unit and continues to ask staff for things and has smart remarks to staff; pt occasionally gets loud and has aggressive tone with staff; pt sometimes noted to be taking to himself; He is currently standing in hallway singing which is starting to agitate another patient; Pt also noted to have made strong sexual passes at male sitter in [facility]

## 2024-01-12 NOTE — ED Notes (Signed)
 TTS in process

## 2024-01-12 NOTE — ED Provider Notes (Signed)
 Emergency Medicine Observation Re-evaluation Note  Kyle Mcmahon is a 28 y.o. male, seen on rounds today.  Pt initially presented to the ED for complaints of Manic Behavior   Physical Exam  BP 129/70 (BP Location: Right Arm)   Pulse 92   Temp 97.6 F (36.4 C) (Oral)   Resp 19   SpO2 100%  Physical Exam  Cardiac: Regular rate Lungs: Normal effort   ED Course / MDM  EKG:EKG Interpretation Date/Time:  Wednesday January 11 2024 13:26:31 EDT Ventricular Rate:  112 PR Interval:  162 QRS Duration:  72 QT Interval:  328 QTC Calculation: 447 R Axis:   39  Text Interpretation: Sinus tachycardia Cannot rule out Inferior infarct , age undetermined Abnormal ECG When compared with ECG of 20-Nov-2017 11:16, HEART RATE has increased Confirmed by Alissa April ([id number]) on 01/12/2024 12:24:28 AM  I have reviewed the labs performed to date as well as medications administered while in observation.  Recent changes in the last 24 hours include Episodes of persistent inappropriate behavior singing, aggressive tone.  Plan  Current plan is for inpatient treatment.  Patient is under IVC.    Trish Furl, MD 01/12/24 [phone]

## 2024-01-12 NOTE — ED Notes (Signed)
IVC CURRENT

## 2024-01-12 NOTE — ED Notes (Signed)
 Report has been called to [facility]. Spoke with Archibald Beard, Charity fundraiser. Pt has no belongings that he arrived with.

## 2024-01-12 NOTE — Progress Notes (Signed)
 Pt has been accepted to [facility] TODAY  01/12/2024 Bed assignment: Main campus  Pt meets inpatient criteria per: [place]  Sulema Endo NP  Attending Physician will be Lavona Pounds, MD  Report can be called to: [phone] (this is a pager, please leave call-back number when giving report)  Pt can arrive ASAP  Care Team Notified:  Heyward Loud NP, Ritta Chessman RN  [place] Modelle Vollmer LCSW-A   01/12/2024 9:23 AM

## 2024-01-23 DIAGNOSIS — F3132 Bipolar disorder, current episode depressed, moderate: Secondary | ICD-10-CM | POA: Diagnosis not present

## 2024-01-23 DIAGNOSIS — F413 Other mixed anxiety disorders: Secondary | ICD-10-CM | POA: Diagnosis not present

## 2024-01-23 DIAGNOSIS — F4321 Adjustment disorder with depressed mood: Secondary | ICD-10-CM | POA: Diagnosis not present

## 2024-01-23 DIAGNOSIS — F3161 Bipolar disorder, current episode mixed, mild: Secondary | ICD-10-CM | POA: Diagnosis not present

## 2024-01-23 DIAGNOSIS — F1990 Other psychoactive substance use, unspecified, uncomplicated: Secondary | ICD-10-CM | POA: Diagnosis not present

## 2024-02-13 DIAGNOSIS — F3161 Bipolar disorder, current episode mixed, mild: Secondary | ICD-10-CM | POA: Diagnosis not present

## 2024-02-13 DIAGNOSIS — F413 Other mixed anxiety disorders: Secondary | ICD-10-CM | POA: Diagnosis not present

## 2024-02-13 DIAGNOSIS — F1990 Other psychoactive substance use, unspecified, uncomplicated: Secondary | ICD-10-CM | POA: Diagnosis not present

## 2024-03-02 DIAGNOSIS — F3161 Bipolar disorder, current episode mixed, mild: Secondary | ICD-10-CM | POA: Diagnosis not present

## 2024-03-02 DIAGNOSIS — F1990 Other psychoactive substance use, unspecified, uncomplicated: Secondary | ICD-10-CM | POA: Diagnosis not present

## 2024-03-02 DIAGNOSIS — F413 Other mixed anxiety disorders: Secondary | ICD-10-CM | POA: Diagnosis not present

## 2024-04-04 ENCOUNTER — Ambulatory Visit: Admission: EM | Admit: 2024-04-04 | Discharge: 2024-04-04 | Disposition: A | Discharge status: Home / Self Care

## 2024-04-04 DIAGNOSIS — K219 Gastro-esophageal reflux disease without esophagitis: Secondary | ICD-10-CM

## 2024-04-04 DIAGNOSIS — L732 Hidradenitis suppurativa: Secondary | ICD-10-CM | POA: Insufficient documentation

## 2024-04-04 DIAGNOSIS — L0291 Cutaneous abscess, unspecified: Secondary | ICD-10-CM | POA: Insufficient documentation

## 2024-04-04 DIAGNOSIS — L700 Acne vulgaris: Secondary | ICD-10-CM | POA: Insufficient documentation

## 2024-04-04 DIAGNOSIS — L906 Striae atrophicae: Secondary | ICD-10-CM | POA: Insufficient documentation

## 2024-04-04 MED ORDER — ALUM & MAG HYDROXIDE-SIMETH 200-200-20 MG/5ML PO SUSP
30.0000 mL | Freq: Once | ORAL | Status: AC
  Administered 2024-04-04: 30 mL via ORAL

## 2024-04-04 MED ORDER — PANTOPRAZOLE SODIUM 20 MG PO TBEC: 20.0000 mg | DELAYED_RELEASE_TABLET | Freq: Every day | ORAL | 0 refills | Status: AC

## 2024-04-04 MED ORDER — SUCRALFATE 1 G PO TABS: 1.0000 g | ORAL_TABLET | Freq: Three times a day (TID) | ORAL | 0 refills | Status: AC

## 2024-04-04 MED ORDER — LIDOCAINE VISCOUS HCL 2 % MT SOLN
15.0000 mL | Freq: Once | OROMUCOSAL | Status: AC
  Administered 2024-04-04: 15 mL via ORAL

## 2024-04-04 NOTE — Discharge Instructions (Signed)
  Please follow up with primary care provider for long term treatment plan.   Follow up in ED with any persistent or worsening pain.

## 2024-04-04 NOTE — ED Triage Notes (Addendum)
 On Sunday, after drinking a lot on Saturday night, I started having real bad mid sternal chest burning/pain (not left side of chest or radiation of pain), I think it could be my acid reflux due to the alcohol . Prilosec has improved it. Seem's to hurt more when swallowing, and around the times I eat.

## 2024-04-05 NOTE — ED Provider Notes (Signed)
 [facility]    CSN: [id number] Arrival date & time: 04/04/24  1533      History   Chief Complaint Chief Complaint  Patient presents with   Heartburn    HPI Kyle Mcmahon is a 28 y.o. male.   Patient here today for evaluation of substernal chest discomfort that is been ongoing since he drank several days ago.  He reports that he has tried Prilosec with some improvement.  He reports that symptoms seem to be worse when he eats and feels as if there is a Systems developer.  He denies any vomiting other than the morning after he drank.  He denies any blood in his vomit.  He has not any black or tarry stools.  He denies abdominal pain.  He has not any fever.  He has not had shortness of breath.  The history is provided by the patient.  Heartburn Associated symptoms include chest pain. Pertinent negatives include no abdominal pain and no shortness of breath.    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    Patient Active Problem List   Diagnosis Date Noted   Acne vulgaris 04/04/2024   Hidradenitis 04/04/2024   Striae 04/04/2024   Abscess 04/04/2024   Cannabis abuse 01/12/2024   Suicidal ideation 01/12/2024   Bipolar 1 disorder (HCC) 08/20/2021   Acute actinic otitis externa, left ear 09/10/2020   Bilateral impacted cerumen 09/10/2020   Snoring 08/27/2019   Prediabetes 03/09/2019   Obesity 02/16/2018   Bipolar I disorder, most recent episode (or current) manic (HCC) 11/18/2017   Sore throat 08/21/2012    Past Surgical History:  Procedure Laterality Date   HERNIA REPAIR     baby   TESTICLE SURGERY     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 Authorizing Provider  doxycycline  (VIBRAMYCIN ) 100 MG capsule Take 100 mg by mouth daily.  09/21/24 Yes [provider]  lamoTRIgine  (LAMICTAL ) 200 MG tablet Take 200 mg by mouth at bedtime.   Yes [provider]  lithium carbonate (ESKALITH) 450 MG ER tablet Take 900 mg by mouth at bedtime. 03/26/24  Yes [provider]  pantoprazole  (PROTONIX ) 20 MG tablet Take 1 tablet (20 mg total) by mouth daily. 04/04/24  Yes Billy Asberry FALCON, PA-C  sucralfate  (CARAFATE ) 1 g tablet Take 1 tablet (1 g total) by mouth with breakfast, with lunch, and with evening meal. 04/04/24  Yes Billy Asberry FALCON, PA-C  ARIPiprazole  ER (ABILIFY  ASIMTUFII) 720 MG/2.4ML PRSY Inject 720 mg into the muscle every 2 (two) months.    [provider]  clindamycin (CLEOCIN T) 1 % lotion Apply 1 Application topically 2 (two) times daily.  09/21/24  [provider]  doxycycline  (VIBRAMYCIN ) 100 MG capsule Take 100 mg by mouth daily.    [provider]  LORazepam  (ATIVAN ) 0.5 MG tablet Take 0.5 mg by mouth 2 (two) times daily as needed for anxiety or sedation.    [provider]  QUEtiapine  (SEROQUEL ) 300 MG tablet Take 300 mg by mouth at bedtime. 01/09/24   [provider]  Semaglutide ,  2 MG/DOSE, (OZEMPIC , 2 MG/DOSE,) 8 MG/3ML SOPN Inject 2 mg into the skin once a week.    [provider]    Family History Family History  Problem Relation Age of Onset   Bipolar disorder Mother    Diabetes Mellitus II Paternal Grandfather    Diabetes Maternal Aunt     Social History Social History   Tobacco Use   Smoking status: Never   Smokeless tobacco: Never  Vaping Use   Vaping status: Never Used  Substance Use Topics   Alcohol  use: Yes    Comment: Occassionally.   Drug use: Not Currently    Types: Marijuana     Allergies   Paliperidone    Review of Systems Review of Systems   Constitutional:  Negative for chills and fever.  Eyes:  Negative for discharge and redness.  Respiratory:  Negative for shortness of breath.   Cardiovascular:  Positive for chest pain.  Gastrointestinal:  Positive for heartburn. Negative for abdominal pain, blood in stool, diarrhea, nausea and vomiting.  Skin:  Positive for color change and wound.  Neurological:  Negative for numbness.     Physical Exam Triage Vital Signs ED Triage Vitals  Encounter Vitals Group     BP 04/04/24 1545 112/71     Girls Systolic BP Percentile --      Girls Diastolic BP Percentile --      Boys Systolic BP Percentile --      Boys Diastolic BP Percentile --      Pulse Rate 04/04/24 1545 95     Resp 04/04/24 1545 18     Temp 04/04/24 1545 98.2 F (36.8 C)     Temp Source 04/04/24 1545 Oral     SpO2 04/04/24 1545 97 %     Weight 04/04/24 1543 265 lb (120.2 kg)     Height 04/04/24 1543 6' (1.829 m)     Head Circumference --      Peak Flow --      Pain Score 04/04/24 1540 6     Pain Loc --      Pain Education --      Exclude from Growth Chart --    No data found.  Updated Vital Signs BP 112/71 (BP Location: Left Arm)   Pulse 95   Temp 98.2 F (36.8 C) (Oral)   Resp 18   Ht 6' (1.829 m)   Wt 265 lb (120.2 kg)   SpO2 97%   BMI 35.94 kg/m   Visual Acuity Right Eye Distance:   Left Eye Distance:   Bilateral Distance:    Right Eye Near:   Left Eye Near:    Bilateral Near:     Physical Exam Vitals and nursing note reviewed.  Constitutional:      General: He is not in acute distress.    Appearance: Normal appearance. He is not ill-appearing.  HENT:     Head: Normocephalic and atraumatic.  Eyes:     Conjunctiva/sclera: Conjunctivae normal.  Cardiovascular:     Rate and Rhythm: Normal rate and regular rhythm.  Pulmonary:     Effort: Pulmonary effort is normal. No respiratory distress.     Breath sounds: Normal breath sounds. No wheezing, rhonchi or rales.  Abdominal:     General:  Abdomen is flat. Bowel sounds are normal. There is no distension.     Palpations: Abdomen is soft.     Tenderness: There is no abdominal tenderness. There is no guarding or rebound.  Neurological:  Mental Status: He is alert.  Psychiatric:        Mood and Affect: Mood normal.        Behavior: Behavior normal.        Thought Content: Thought content normal.      UC Treatments / Results  Labs (all labs ordered are listed, but only abnormal results are displayed) Labs Reviewed - No data to display  EKG   Radiology No results found.  Procedures Procedures (including critical care time)  Medications Ordered in UC Medications  alum & mag hydroxide-simeth (MAALOX/MYLANTA) 200-200-20 MG/5ML suspension 30 mL (30 mLs Oral Given 04/04/24 1555)    And  lidocaine  (XYLOCAINE ) 2 % viscous mouth solution 15 mL (15 mLs Oral Given 04/04/24 1554)    Initial Impression / Assessment and Plan / UC Course  I have reviewed the triage vital signs and the nursing notes.  Pertinent labs & imaging results that were available during my care of the patient were reviewed by me and considered in my medical decision making (see chart for details).    Patient does improve clinically with GI cocktail.  He is in no acute distress, very low suspicion for cardiac etiology of symptoms.  Suspect likely esophageal reflux and will trial Protonix  and replacement of omeprazole as well as Carafate .  Advised he should follow-up with his primary care doctor soon as possible.  Recommended evaluation in the emergency room with any worsening symptoms.  Final Clinical Impressions(s) / UC Diagnoses   Final diagnoses:  Gastroesophageal reflux disease, unspecified whether esophagitis present     Discharge Instructions       Please follow up with primary care provider for long term treatment plan.   Follow up in ED with any persistent or worsening pain.       ED Prescriptions     Medication Sig Dispense  Auth. Provider   pantoprazole  (PROTONIX ) 20 MG tablet Take 1 tablet (20 mg total) by mouth daily. 30 tablet Billy Stabs F, PA-C   sucralfate  (CARAFATE ) 1 g tablet Take 1 tablet (1 g total) by mouth with breakfast, with lunch, and with evening meal. 30 tablet Billy Stabs FALCON, PA-C      PDMP not reviewed this encounter.   Billy Stabs FALCON, PA-C 04/05/24 [phone]
